# Patient Record
Sex: Female | Born: 1955 | Race: Black or African American | Hispanic: No | Smoking: Former smoker
Health system: Southern US, Community
[De-identification: ages and names within clinical notes are randomized; demographics above are authoritative.]

## PROBLEM LIST (undated history)

## (undated) DIAGNOSIS — E7800 Pure hypercholesterolemia, unspecified: Secondary | ICD-10-CM

## (undated) DIAGNOSIS — I10 Essential (primary) hypertension: Secondary | ICD-10-CM

## (undated) DIAGNOSIS — Z86010 Personal history of colonic polyps: Secondary | ICD-10-CM

## (undated) HISTORY — DX: Personal history of colonic polyps: Z86.010

## (undated) HISTORY — PX: INCISION AND DRAINAGE: SHX5863

---

## 1997-05-09 HISTORY — PX: ABDOMINAL HYSTERECTOMY: SHX81

## 2002-04-15 ENCOUNTER — Ambulatory Visit (HOSPITAL_COMMUNITY): Admission: RE | Admit: 2002-04-15 | Discharge: 2002-04-15 | Payer: Self-pay | Admitting: Family Medicine

## 2002-04-15 ENCOUNTER — Encounter: Payer: Self-pay | Admitting: Family Medicine

## 2002-05-09 HISTORY — PX: OTHER SURGICAL HISTORY: SHX169

## 2002-06-06 ENCOUNTER — Encounter: Admission: RE | Admit: 2002-06-06 | Discharge: 2002-06-06 | Payer: Self-pay | Admitting: Family Medicine

## 2002-06-06 ENCOUNTER — Encounter: Payer: Self-pay | Admitting: Family Medicine

## 2003-06-24 ENCOUNTER — Emergency Department (HOSPITAL_COMMUNITY): Admission: EM | Admit: 2003-06-24 | Discharge: 2003-06-25 | Payer: Self-pay | Admitting: Emergency Medicine

## 2003-11-15 ENCOUNTER — Emergency Department (HOSPITAL_COMMUNITY): Admission: EM | Admit: 2003-11-15 | Discharge: 2003-11-15 | Payer: Self-pay | Admitting: Emergency Medicine

## 2004-03-16 ENCOUNTER — Other Ambulatory Visit: Admission: RE | Admit: 2004-03-16 | Discharge: 2004-03-16 | Payer: Self-pay | Admitting: Family Medicine

## 2004-05-09 HISTORY — PX: COLONOSCOPY: SHX174

## 2004-10-29 ENCOUNTER — Ambulatory Visit (HOSPITAL_COMMUNITY): Admission: RE | Admit: 2004-10-29 | Discharge: 2004-10-29 | Payer: Self-pay | Admitting: Gastroenterology

## 2005-02-21 ENCOUNTER — Emergency Department (HOSPITAL_COMMUNITY): Admission: EM | Admit: 2005-02-21 | Discharge: 2005-02-21 | Payer: Self-pay | Admitting: Emergency Medicine

## 2005-04-26 ENCOUNTER — Emergency Department (HOSPITAL_COMMUNITY): Admission: EM | Admit: 2005-04-26 | Discharge: 2005-04-26 | Payer: Self-pay | Admitting: Emergency Medicine

## 2005-08-16 ENCOUNTER — Emergency Department (HOSPITAL_COMMUNITY): Admission: EM | Admit: 2005-08-16 | Discharge: 2005-08-16 | Payer: Self-pay | Admitting: Emergency Medicine

## 2005-08-30 ENCOUNTER — Encounter (INDEPENDENT_AMBULATORY_CARE_PROVIDER_SITE_OTHER): Payer: Self-pay | Admitting: *Deleted

## 2005-08-30 ENCOUNTER — Ambulatory Visit (HOSPITAL_BASED_OUTPATIENT_CLINIC_OR_DEPARTMENT_OTHER): Admission: RE | Admit: 2005-08-30 | Discharge: 2005-08-30 | Payer: Self-pay | Admitting: Surgery

## 2006-01-28 ENCOUNTER — Emergency Department: Payer: Self-pay | Admitting: Emergency Medicine

## 2006-12-26 ENCOUNTER — Emergency Department (HOSPITAL_COMMUNITY): Admission: EM | Admit: 2006-12-26 | Discharge: 2006-12-26 | Payer: Self-pay | Admitting: Emergency Medicine

## 2007-03-07 ENCOUNTER — Emergency Department (HOSPITAL_COMMUNITY): Admission: EM | Admit: 2007-03-07 | Discharge: 2007-03-07 | Payer: Self-pay | Admitting: Emergency Medicine

## 2008-02-12 ENCOUNTER — Emergency Department (HOSPITAL_COMMUNITY): Admission: EM | Admit: 2008-02-12 | Discharge: 2008-02-12 | Payer: Self-pay | Admitting: Emergency Medicine

## 2008-04-19 ENCOUNTER — Emergency Department (HOSPITAL_COMMUNITY): Admission: EM | Admit: 2008-04-19 | Discharge: 2008-04-19 | Payer: Self-pay | Admitting: Emergency Medicine

## 2008-11-11 ENCOUNTER — Emergency Department (HOSPITAL_COMMUNITY): Admission: EM | Admit: 2008-11-11 | Discharge: 2008-11-12 | Payer: Self-pay | Admitting: Emergency Medicine

## 2009-08-12 ENCOUNTER — Emergency Department (HOSPITAL_COMMUNITY): Admission: EM | Admit: 2009-08-12 | Discharge: 2009-08-12 | Payer: Self-pay | Admitting: Emergency Medicine

## 2009-10-10 ENCOUNTER — Emergency Department (HOSPITAL_COMMUNITY): Admission: EM | Admit: 2009-10-10 | Discharge: 2009-10-10 | Payer: Self-pay | Admitting: Emergency Medicine

## 2009-11-01 ENCOUNTER — Emergency Department (HOSPITAL_COMMUNITY): Admission: EM | Admit: 2009-11-01 | Discharge: 2009-11-01 | Payer: Self-pay | Admitting: Emergency Medicine

## 2009-12-20 ENCOUNTER — Emergency Department (HOSPITAL_COMMUNITY): Admission: EM | Admit: 2009-12-20 | Discharge: 2009-12-20 | Payer: Self-pay | Admitting: Emergency Medicine

## 2010-04-17 ENCOUNTER — Emergency Department (HOSPITAL_COMMUNITY)
Admission: EM | Admit: 2010-04-17 | Discharge: 2010-04-17 | Payer: Self-pay | Source: Home / Self Care | Admitting: Emergency Medicine

## 2010-07-19 LAB — URINALYSIS, ROUTINE W REFLEX MICROSCOPIC
Bilirubin Urine: NEGATIVE
Glucose, UA: NEGATIVE mg/dL
Hgb urine dipstick: NEGATIVE
Ketones, ur: NEGATIVE mg/dL
Specific Gravity, Urine: 1.02 (ref 1.005–1.030)
Urobilinogen, UA: 0.2 mg/dL (ref 0.0–1.0)
pH: 6 (ref 5.0–8.0)

## 2010-07-19 LAB — URINE MICROSCOPIC-ADD ON

## 2010-07-19 LAB — URINE CULTURE
Colony Count: >=100000
Culture  Setup Time: 201112101739

## 2010-07-25 LAB — CBC
HCT: 40.8 % (ref 36.0–46.0)
Hemoglobin: 14.3 g/dL (ref 12.0–15.0)
MCHC: 34.9 g/dL (ref 30.0–36.0)
RBC: 4.55 MIL/uL (ref 3.87–5.11)
RDW: 12.4 % (ref 11.5–15.5)

## 2010-07-25 LAB — DIFFERENTIAL
Lymphocytes Relative: 17 % (ref 12–46)
Lymphs Abs: 1.1 10*3/uL (ref 0.7–4.0)
Monocytes Absolute: 1.5 10*3/uL — ABNORMAL HIGH (ref 0.1–1.0)

## 2010-07-25 LAB — POCT I-STAT, CHEM 8
Chloride: 108 mEq/L (ref 96–112)
Glucose, Bld: 92 mg/dL (ref 70–99)
HCT: 44 % (ref 36.0–46.0)
Potassium: 4.1 mEq/L (ref 3.5–5.1)
Sodium: 140 mEq/L (ref 135–145)

## 2010-09-24 NOTE — Op Note (Signed)
Amy Vincent, Amy Vincent               ACCOUNT NO.:  [id number]   MEDICAL RECORD NO.:  [id number]          PATIENT TYPE:  AMB   LOCATION:  ENDO                         FACILITY:  [facility]   PHYSICIAN:  Anselmo Rod, M.D.  DATE OF BIRTH:  Nov 10, 1955   DATE OF PROCEDURE:  10/29/2004  DATE OF DISCHARGE:                                 OPERATIVE REPORT   PROCEDURE PERFORMED:  Screening colonoscopy.   ENDOSCOPIST:  Anselmo Rod, M.D.   INSTRUMENT USED:  Olympus video colonoscope.   INDICATIONS FOR PROCEDURE:  A 55 year old, African-American female with a  family history of colon cancer, undergoing a screening colonoscopy.  The  patient has a history of rectal bleeding on one occasion, with guaiac  positive stools found on a physical exam in the office.  Rule out colonic  polyps, masses, etc.   PRE-PROCEDURE PREPARATION:  Informed consent was procured from the patient.  The patient was fasted for eight hours prior to the procedure and prepped  with a bottle of magnesium citrate and a gallon of GoLYTELY the night prior  to the procedure.  The risks and benefits of the procedure including a 10%  miss rate of cancer and polyps were discussed with the patient as well.   PRE-PROCEDURE PHYSICAL:  VITAL SIGNS:  The patient had stable vital signs.  NECK:  Supple.  CHEST:  Clear to auscultation.  CARDIOVASCULAR:  S1, S2 regular.  ABDOMEN:  Soft, with normal bowel sounds.   DESCRIPTION OF THE PROCEDURE:  The patient was placed in the left lateral  decubitus position, sedated with 50 mg of Demerol and 5 mg of Versed in slow  incremental doses.  Once the patient was adequately sedated and maintained  on low-flow oxygen and continuous cardiac monitoring, the Olympus video  colonoscope was advanced from the rectum to the cecum.  There was some  residual stool in the colon.  Multiple washings were done.  No masses,  polyps, erosions, ulcerations, or diverticula were seen.  Retroflexion in  the  rectum revealed no abnormalities.   IMPRESSION:  Normal colonoscopy up to the cecum.  No masses, polyps, or  diverticula seen.  No source of bleeding identified.   RECOMMENDATIONS:  1.  Continue a high-fiber diet, with liberal fluid intake.  Repeat guaiac      testing will be done in the next three months, and further      recommendations made as needed.  2.  Repeat colonoscopy in the next five years or if need be.       JNM/MEDQ  D:  10/29/2004  T:  10/30/2004  Job:  [id number]   cc:   Candyce Churn. Allyne Gee, M.D.  [address]  [address]  [place]  [place] [address]  Fax: [phone]

## 2010-09-24 NOTE — Op Note (Signed)
Amy Vincent, Amy Vincent               ACCOUNT NO.:  [id number]   MEDICAL RECORD NO.:  [id number]          PATIENT TYPE:  AMB   LOCATION:  DSC                          FACILITY:  [facility]   PHYSICIAN:  Wilmon Arms. Corliss Skains, M.D. DATE OF BIRTH:  1956-04-22   DATE OF PROCEDURE:  08/30/2005  DATE OF DISCHARGE:                                 OPERATIVE REPORT   PREOPERATIVE DIAGNOSIS:  Lipoma of the back.   POSTOPERATIVE DIAGNOSIS:  Lipoma of the back.   PROCEDURE PERFORMED:  Excision of lipoma of the mid back.   SURGEON:  Wilmon Arms. Tsuei, M.D.   ANESTHESIA:  General endotracheal.   INDICATIONS:  The patient is a 55 year old female who presents with a four  year history of a slow growing mass in the back. This has become  uncomfortable. On examination, it was felt to be a lipoma. Due to its  location, the decision was made to proceed with excision in the operating  room.   DESCRIPTION OF PROCEDURE:  The patient was brought to the operating room and  placed in a supine position on her stretcher. After an adequate level of  general endotracheal anesthesia was obtained, she was flipped to a prone  position with appropriate padding. Once her airway was secure, her back was  prepped with Betadine and draped in sterile fashion. The area over the  lipoma was infiltrated with 10 mL of 0.25% Marcaine with epinephrine. A  transverse incision was made. Dissection was carried down to the lipoma. The  lipoma was densely adherent to the surrounding tissue. This was extended all  the way down to the fascia. The lipoma was excised in its entirety using  cautery. Once the lipoma was excised, it was sent for pathologic  examination. The wound was then irrigated. Due to the large cavity, a 7  Blake drain was inserted through a stab incision to prevent a seroma. The  wound was closed with a deep layer of 3-0 Vicryl and a subcuticular layer of  4-0 Monocryl. Steri-Strips and clean dressing was applied. The drain  was  placed to bulb suction. The patient was then explained and brought to  recovery in stable condition. All sponge, instrument, and needle counts were  correct.      Wilmon Arms. Tsuei, M.D.  Electronically Signed     MKT/MEDQ  D:  08/30/2005  T:  08/30/2005  Job:  [id number]

## 2011-02-16 LAB — CBC
HCT: 40.3
Hemoglobin: 14
MCHC: 34.7
MCV: 85.2
Platelets: 282
RBC: 4.73
RDW: 13.2
WBC: 7.4

## 2011-02-16 LAB — URINALYSIS, ROUTINE W REFLEX MICROSCOPIC
Bilirubin Urine: NEGATIVE
Glucose, UA: NEGATIVE
Hgb urine dipstick: NEGATIVE
Ketones, ur: NEGATIVE
Nitrite: NEGATIVE
Protein, ur: NEGATIVE
Specific Gravity, Urine: 1.03
Urobilinogen, UA: 1
pH: 6

## 2011-02-16 LAB — DIFFERENTIAL
Eosinophils Absolute: 0.3
Lymphocytes Relative: 37
Lymphs Abs: 2.7
Monocytes Absolute: 0.9 — ABNORMAL HIGH
Monocytes Relative: 13 — ABNORMAL HIGH
Neutro Abs: 3.3
Neutrophils Relative %: 45

## 2011-02-16 LAB — URINE MICROSCOPIC-ADD ON

## 2011-02-16 LAB — GC/CHLAMYDIA PROBE AMP, GENITAL
Chlamydia, DNA Probe: NEGATIVE
GC Probe Amp, Genital: NEGATIVE

## 2011-02-16 LAB — RPR: RPR Ser Ql: NONREACTIVE

## 2011-03-18 ENCOUNTER — Emergency Department (HOSPITAL_COMMUNITY)
Admission: EM | Admit: 2011-03-18 | Discharge: 2011-03-18 | Disposition: A | Discharge status: Home / Self Care | Payer: Self-pay | Attending: Emergency Medicine | Admitting: Emergency Medicine

## 2011-03-18 ENCOUNTER — Encounter: Payer: Self-pay | Admitting: *Deleted

## 2011-03-18 DIAGNOSIS — M62830 Muscle spasm of back: Secondary | ICD-10-CM

## 2011-03-18 DIAGNOSIS — M546 Pain in thoracic spine: Secondary | ICD-10-CM | POA: Insufficient documentation

## 2011-03-18 DIAGNOSIS — M5380 Other specified dorsopathies, site unspecified: Secondary | ICD-10-CM | POA: Insufficient documentation

## 2011-03-18 DIAGNOSIS — R32 Unspecified urinary incontinence: Secondary | ICD-10-CM | POA: Insufficient documentation

## 2011-03-18 DIAGNOSIS — R209 Unspecified disturbances of skin sensation: Secondary | ICD-10-CM | POA: Insufficient documentation

## 2011-03-18 DIAGNOSIS — I10 Essential (primary) hypertension: Secondary | ICD-10-CM | POA: Insufficient documentation

## 2011-03-18 HISTORY — DX: Essential (primary) hypertension: I10

## 2011-03-18 MED ORDER — IBUPROFEN 600 MG PO TABS: 600.0000 mg | ORAL_TABLET | Freq: Four times a day (QID) | ORAL | Status: AC | PRN

## 2011-03-18 MED ORDER — KETOROLAC TROMETHAMINE 60 MG/2ML IM SOLN
60.0000 mg | Freq: Once | INTRAMUSCULAR | Status: AC
  Administered 2011-03-18: 60 mg via INTRAMUSCULAR
  Filled 2011-03-18: qty 2

## 2011-03-18 MED ORDER — DIAZEPAM 5 MG PO TABS: 5.0000 mg | ORAL_TABLET | Freq: Two times a day (BID) | ORAL | Status: AC

## 2011-03-18 MED ORDER — DIAZEPAM 5 MG/ML IJ SOLN
5.0000 mg | Freq: Once | INTRAMUSCULAR | Status: AC
  Administered 2011-03-18: 10 mg via INTRAMUSCULAR
  Filled 2011-03-18: qty 2

## 2011-03-18 NOTE — ED Provider Notes (Signed)
Medical screening examination/treatment/procedure(s) were performed by non-physician practitioner and as supervising physician I was immediately available for consultation/collaboration.   [name], MD 03/18/11 1843 

## 2011-03-18 NOTE — ED Provider Notes (Signed)
History     CSN: [id number] Arrival date & time: 03/18/2011  8:27 AM   First MD Initiated Contact with Patient 03/18/11 [phone]      Chief Complaint  Patient presents with  . Back Pain    (Consider location/radiation/quality/duration/timing/severity/associated sxs/prior treatment) HPI Comments: Patient here with mid thoracic back pain without radiation/  Reports that the pain is episodic in nature depending on how she moves.  States that she was getting her clothes on today when this occurred.  Reports a history of same in the past and noted that it was muscle spasm.  Denies fever, chills, chest pain, shortness of breath, reports that when the pain came on today, it was so bad that she urinated on herself but denies any retention or further loss of control of bowels of bladder.  Patient is a 55 y.o. female presenting with back pain. The history is provided by the patient. No language interpreter was used.  Back Pain  This is a new problem. The current episode started 3 to 5 hours ago. The problem occurs constantly. The problem has been gradually worsening. The pain is associated with no known injury. The pain is present in the thoracic spine. The quality of the pain is described as stabbing. The pain does not radiate. The pain is at a severity of 8/10. The pain is moderate. The symptoms are aggravated by certain positions. The pain is worse during the day. Associated symptoms include bladder incontinence and paresthesias. Pertinent negatives include no chest pain, no numbness, no headaches, no paresis, no tingling and no weakness.    Past Medical History  Diagnosis Date  . Hypertension     History reviewed. No pertinent past surgical history.  History reviewed. No pertinent family history.  History  Substance Use Topics  . Smoking status: Never Smoker   . Smokeless tobacco: Never Used  . Alcohol Use: Yes    OB History    Grav Para Term Preterm Abortions TAB SAB Ect Mult Living                Review of Systems  Constitutional: Negative.   HENT: Negative.   Respiratory: Negative.   Cardiovascular: Negative for chest pain.  Genitourinary: Positive for bladder incontinence. Negative for flank pain and decreased urine volume.  Musculoskeletal: Positive for back pain.  Neurological: Positive for paresthesias. Negative for tingling, weakness, numbness and headaches.  Hematological: Negative.   Psychiatric/Behavioral: Negative.     Allergies  Review of patient's allergies indicates no known allergies.  Home Medications  No current outpatient prescriptions on file.  BP 150/101  Pulse 73  Temp(Src) 97.9 F (36.6 C) (Oral)  Resp 18  SpO2 98%  Physical Exam  Nursing note and vitals reviewed. Constitutional: She is oriented to person, place, and time. She appears well-developed and well-nourished.  HENT:  Head: Normocephalic and atraumatic.  Eyes: Conjunctivae are normal. Pupils are equal, round, and reactive to light.  Neck: Normal range of motion. Neck supple.  Cardiovascular: Normal rate, regular rhythm, normal heart sounds and intact distal pulses.   Pulmonary/Chest: Effort normal and breath sounds normal. No respiratory distress. She exhibits no tenderness.  Abdominal: Soft. Bowel sounds are normal.  Musculoskeletal: She exhibits tenderness. She exhibits no edema.       Thoracic back: She exhibits tenderness and spasm. She exhibits normal range of motion and no bony tenderness.  Neurological: She is alert and oriented to person, place, and time. No cranial nerve deficit.  Skin: Skin is  warm and dry.  Psychiatric: She has a normal mood and affect. Her behavior is normal. Judgment and thought content normal.    ED Course  Procedures (including critical care time)  Labs Reviewed - No data to display No results found.   Muscle Spasm   MDM  Patient with MSK mid back pain - no rash noted, worse with movement.  Plan to give toradol and valium here  in the ER and will re-evaluate        Scarlette Calico C. [place], [place] 03/18/11 1610  Patient reports marked improvement in pain after medication.  Will prescribe short course of same.  Izola Price [place], [place] 03/18/11 1028

## 2011-03-18 NOTE — ED Notes (Signed)
See triage note. Pt reports it feels like severe muscle spasms. No numbness or tingling associated with pain to lower extremities.

## 2011-03-18 NOTE — ED Notes (Signed)
Pt reports she was getting dressed them am and sudden onset of bilateral mid back pain, reports pain was so intense she had period of incontinence. Pt has been ambulatory. No fall associated.

## 2011-03-18 NOTE — ED Notes (Signed)
Patient laying on her stomach.   She reports onset of mid back pain when she was getting ready.  Patient states the pain caused her to "pee" on herself.  Patient denies hx of similar sx.  Patient states she has not had a recent cough nor has she traveled.

## 2011-04-25 ENCOUNTER — Emergency Department (HOSPITAL_COMMUNITY)
Admission: EM | Admit: 2011-04-25 | Discharge: 2011-04-25 | Disposition: A | Discharge status: Home / Self Care | Payer: Self-pay | Attending: Emergency Medicine | Admitting: Emergency Medicine

## 2011-04-25 DIAGNOSIS — R1031 Right lower quadrant pain: Secondary | ICD-10-CM | POA: Insufficient documentation

## 2011-04-25 DIAGNOSIS — K089 Disorder of teeth and supporting structures, unspecified: Secondary | ICD-10-CM | POA: Insufficient documentation

## 2011-04-25 DIAGNOSIS — R109 Unspecified abdominal pain: Secondary | ICD-10-CM | POA: Insufficient documentation

## 2011-04-25 DIAGNOSIS — K047 Periapical abscess without sinus: Secondary | ICD-10-CM | POA: Insufficient documentation

## 2011-04-25 DIAGNOSIS — I10 Essential (primary) hypertension: Secondary | ICD-10-CM | POA: Insufficient documentation

## 2011-04-25 LAB — URINE MICROSCOPIC-ADD ON

## 2011-04-25 LAB — URINALYSIS, ROUTINE W REFLEX MICROSCOPIC
Glucose, UA: NEGATIVE mg/dL
Hgb urine dipstick: NEGATIVE
Protein, ur: NEGATIVE mg/dL
Specific Gravity, Urine: 1.024 (ref 1.005–1.030)
pH: 6.5 (ref 5.0–8.0)

## 2011-04-25 LAB — COMPREHENSIVE METABOLIC PANEL
ALT: 38 U/L — ABNORMAL HIGH (ref 0–35)
AST: 30 U/L (ref 0–37)
CO2: 25 mEq/L (ref 19–32)
Calcium: 9.1 mg/dL (ref 8.4–10.5)
Creatinine, Ser: 0.67 mg/dL (ref 0.50–1.10)
GFR calc non Af Amer: >90 mL/min (ref >90)
Sodium: 140 mEq/L (ref 135–145)
Total Protein: 7.2 g/dL (ref 6.0–8.3)

## 2011-04-25 LAB — CBC
Hemoglobin: 14.3 g/dL (ref 12.0–15.0)
MCH: 30 pg (ref 26.0–34.0)
MCV: 84.7 fL (ref 78.0–100.0)
Platelets: 213 10*3/uL (ref 150–400)
RBC: 4.77 MIL/uL (ref 3.87–5.11)
WBC: 4.6 10*3/uL (ref 4.0–10.5)

## 2011-04-25 LAB — DIFFERENTIAL
Eosinophils Relative: 6 % — ABNORMAL HIGH (ref 0–5)
Lymphocytes Relative: 45 % (ref 12–46)
Lymphs Abs: 2.1 10*3/uL (ref 0.7–4.0)
Monocytes Relative: 13 % — ABNORMAL HIGH (ref 3–12)
Neutrophils Relative %: 34 % — ABNORMAL LOW (ref 43–77)

## 2011-04-25 MED ORDER — PENICILLIN V POTASSIUM 500 MG PO TABS: 500.0000 mg | ORAL_TABLET | Freq: Three times a day (TID) | ORAL | Status: AC

## 2011-04-25 MED ORDER — HYDROCHLOROTHIAZIDE 25 MG PO TABS: 25.0000 mg | ORAL_TABLET | Freq: Every day | ORAL | Status: DC

## 2011-04-25 NOTE — ED Provider Notes (Signed)
History     CSN: [id number] Arrival date & time: 04/25/2011  8:29 AM   First MD Initiated Contact with Patient 04/25/11 0930      10:25 AM HPI Patient reports she had a dental abscess low her left central incisor. States she called her dentist but could not afford to go to him. States he recommended that she reversed with abscess. States she did this and immediately began to have pain in her right lower abdomen. States pain is worse been persistent since event. Denies change in pain. Denies ability to reproduce pain. Denies associated nausea, vomiting, diarrhea, back pain, fever, urinary symptoms, vaginal symptoms describes pain as a nagging pain. Reports she took 100 mg of ibuprofen without improvement. Reports a history of a hysterectomy otherwise denies abdominal surgeries. Also reports a normal colonoscopy 5 years. Patient is a 55 y.o. female presenting with abdominal pain. The history is provided by the patient.  Abdominal Pain The primary symptoms of the illness include abdominal pain. The primary symptoms of the illness do not include fever, nausea, vomiting, dysuria, vaginal discharge or vaginal bleeding. The current episode started more than 2 days ago. The onset of the illness was sudden. The problem has not changed since onset. The patient states that she believes she is currently not pregnant. The patient has not had a change in bowel habit. Symptoms associated with the illness do not include chills, anorexia, heartburn, constipation, urgency, hematuria, frequency or back pain.    Past Medical History  Diagnosis Date  . Hypertension     No past surgical history on file.  No family history on file.  History  Substance Use Topics  . Smoking status: Never Smoker   . Smokeless tobacco: Never Used  . Alcohol Use: Yes    OB History    Grav Para Term Preterm Abortions TAB SAB Ect Mult Living                  Review of Systems  Constitutional: Negative for fever and  chills.  Gastrointestinal: Positive for abdominal pain. Negative for heartburn, nausea, vomiting, constipation and anorexia.  Genitourinary: Negative for dysuria, urgency, frequency, hematuria, flank pain, vaginal bleeding, vaginal discharge and vaginal pain.  Musculoskeletal: Negative for back pain.  All other systems reviewed and are negative.    Allergies  Review of patient's allergies indicates no known allergies.  Home Medications   Current Outpatient Rx  Name Route Sig Dispense Refill  . IBUPROFEN 800 MG PO TABS Oral Take 800 mg by mouth every 6 (six) hours as needed. For back pain       BP 150/96  Pulse 70  Temp(Src) 98 F (36.7 C) (Oral)  Resp 18  Ht 5' 3.5" (1.613 m)  Wt 160 lb (72.576 kg)  BMI 27.90 kg/m2  SpO2 99%  Physical Exam  Vitals reviewed. Constitutional: She is oriented to person, place, and time. Vital signs are normal. She appears well-developed and well-nourished.  HENT:  Head: Normocephalic and atraumatic.  Mouth/Throat: Dental abscesses (inferior to the left central incisor patient has a small area of dental abscess. No longer tender.) present.  Eyes: Conjunctivae are normal. Pupils are equal, round, and reactive to light.  Neck: Normal range of motion. Neck supple.  Cardiovascular: Normal rate, regular rhythm and normal heart sounds.  Exam reveals no friction rub.   No murmur heard. Pulmonary/Chest: Effort normal and breath sounds normal. She has no wheezes. She has no rhonchi. She has no rales. She exhibits no  tenderness.  Abdominal: Soft. Bowel sounds are normal. She exhibits no distension and no mass. There is no tenderness. There is no rebound and no guarding.  Musculoskeletal: Normal range of motion.  Neurological: She is alert and oriented to person, place, and time. Coordination normal.  Skin: Skin is warm and dry. No rash noted. No erythema. No pallor.    ED Course  Procedures    MDM     Discuss slight elevation in ALT and alkaline  phosphatase. Patient has absolutely no upper abdominal pain. No other significant findings active because of abdominal pain. Patient reports pain is not severe does not requesting medication. States she was just concerned pain was associated with popping dental abscess. States "I was scared it became an infection"     Thomasene Lot, [place] 04/25/11 1316  Discussed hypertension. Patient states she has not taken her blood pressure medication in one year. Blood pressure was 163/102 when I entered the room. States she does not have a primary care physician. Will refer patient to outpatient clinics in [facility] we'll write a prescription for HCTZ 25 mg.  Thomasene Lot, PA 04/25/11 1317

## 2011-04-25 NOTE — ED Notes (Signed)
Pt c/o abdominal pain and h/a-"popped" abcess in mouth-symptoms occurred after

## 2011-04-25 NOTE — ED Provider Notes (Signed)
Evaluation and management procedures were performed by the PA/NP under my supervision/collaboration.  Dione Booze, MD 04/25/11 [phone]

## 2012-01-30 ENCOUNTER — Emergency Department (HOSPITAL_COMMUNITY)
Admission: EM | Admit: 2012-01-30 | Discharge: 2012-01-30 | Disposition: A | Discharge status: Home / Self Care | Payer: Medicaid Other | Attending: Emergency Medicine | Admitting: Emergency Medicine

## 2012-01-30 ENCOUNTER — Encounter (HOSPITAL_COMMUNITY): Payer: Self-pay | Admitting: *Deleted

## 2012-01-30 ENCOUNTER — Emergency Department (HOSPITAL_COMMUNITY): Payer: Medicaid Other

## 2012-01-30 DIAGNOSIS — I10 Essential (primary) hypertension: Secondary | ICD-10-CM | POA: Insufficient documentation

## 2012-01-30 DIAGNOSIS — M25429 Effusion, unspecified elbow: Secondary | ICD-10-CM | POA: Insufficient documentation

## 2012-01-30 DIAGNOSIS — R109 Unspecified abdominal pain: Secondary | ICD-10-CM | POA: Insufficient documentation

## 2012-01-30 LAB — URINALYSIS, ROUTINE W REFLEX MICROSCOPIC
Glucose, UA: NEGATIVE mg/dL
Hgb urine dipstick: NEGATIVE
Nitrite: NEGATIVE
Urobilinogen, UA: 0.2 mg/dL (ref 0.0–1.0)
pH: 7 (ref 5.0–8.0)

## 2012-01-30 LAB — WET PREP, GENITAL
Clue Cells Wet Prep HPF POC: NONE SEEN
Trich, Wet Prep: NONE SEEN
Yeast Wet Prep HPF POC: NONE SEEN

## 2012-01-30 LAB — URINE MICROSCOPIC-ADD ON

## 2012-01-30 MED ORDER — TRAMADOL HCL 50 MG PO TABS
50.0000 mg | ORAL_TABLET | Freq: Once | ORAL | Status: AC
  Administered 2012-01-30: 50 mg via ORAL
  Filled 2012-01-30: qty 1

## 2012-01-30 MED ORDER — TRAMADOL HCL 50 MG PO TABS: 50.0000 mg | ORAL_TABLET | Freq: Four times a day (QID) | ORAL | Status: DC | PRN

## 2012-01-30 MED ORDER — ONDANSETRON 4 MG PO TBDP
ORAL_TABLET | ORAL | Status: AC
  Filled 2012-01-30: qty 2

## 2012-01-30 MED ORDER — ONDANSETRON 4 MG PO TBDP
8.0000 mg | ORAL_TABLET | Freq: Once | ORAL | Status: AC
  Administered 2012-01-30: 8 mg via ORAL

## 2012-01-30 MED ORDER — ONDANSETRON HCL 4 MG PO TABS: 4.0000 mg | ORAL_TABLET | Freq: Four times a day (QID) | ORAL | Status: DC

## 2012-01-30 NOTE — ED Notes (Signed)
To ED for eval of right lower quad pain for past cple days. Also c/o left arm pain at site of previous surgery. Pt states arm pain increases with movement. No urinary diff.

## 2012-01-30 NOTE — ED Notes (Signed)
Pt c/o left forearm pain, some swelling noted. Reporting vaginal discharge after taking antibiotics for dental infection. No vaginal pain or urinary s/s. Reporting RLQ pain, intermittent x 2 days. No n/v. A x 4

## 2012-01-30 NOTE — Progress Notes (Signed)
Orthopedic Tech Progress Note Patient Details:  Amy Vincent 02-Jul-1955 [id number] Arm sling applied to Left UE. Housekeeping staff also present in room. Patient stated it was ok to treat. Ortho Devices Type of Ortho Device: Arm foam sling Ortho Device/Splint Location: Applied to Left UE Ortho Device/Splint Interventions: Application   Asia R Thompson 01/30/2012, 11:26 AM

## 2012-01-30 NOTE — ED Notes (Signed)
Patient transported to X-ray 

## 2012-01-30 NOTE — ED Notes (Signed)
Called ortho to apply arm sling

## 2012-01-30 NOTE — ED Provider Notes (Signed)
History     CSN: [id number]  Arrival date & time 01/30/12  1308   First MD Initiated Contact with Patient 01/30/12 [phone]      Chief Complaint  Patient presents with  . Arm Pain  . Abdominal Pain    (Consider location/radiation/quality/duration/timing/severity/associated sxs/prior treatment) HPI Pt p/w 1 week of L elbow pain, worse with movement. No trauma. +previous surgery on elbow. No swelling, redness, or warmth.   Pt also c/o R pelvic pain and white vaginal d/c after finishing course of abx for dental abscess. No fever, chills, N/V/D or urinary symptoms.  Past Medical History  Diagnosis Date  . Hypertension     History reviewed. No pertinent past surgical history.  History reviewed. No pertinent family history.  History  Substance Use Topics  . Smoking status: Never Smoker   . Smokeless tobacco: Never Used  . Alcohol Use: Yes    OB History    Grav Para Term Preterm Abortions TAB SAB Ect Mult Living                  Review of Systems  Constitutional: Negative for fever, chills and fatigue.  Gastrointestinal: Positive for abdominal pain. Negative for nausea, vomiting and diarrhea.  Genitourinary: Positive for vaginal discharge. Negative for dysuria, hematuria, flank pain, vaginal bleeding and vaginal pain.  Musculoskeletal: Positive for arthralgias. Negative for back pain.  Skin: Negative for rash and wound.  Neurological: Negative for dizziness, weakness, numbness and headaches.    Allergies  Review of patient's allergies indicates no known allergies.  Home Medications   Current Outpatient Rx  Name Route Sig Dispense Refill  . IBUPROFEN 200 MG PO TABS Oral Take 200 mg by mouth every 6 (six) hours as needed. For pain    . ONDANSETRON HCL 4 MG PO TABS Oral Take 1 tablet (4 mg total) by mouth every 6 (six) hours. 12 tablet 0  . TRAMADOL HCL 50 MG PO TABS Oral Take 1 tablet (50 mg total) by mouth every 6 (six) hours as needed for pain. 15 tablet 0    BP  178/103  Pulse 75  Temp 98.2 F (36.8 C) (Oral)  Resp 16  SpO2 95%  Physical Exam  Nursing note and vitals reviewed. Constitutional: She is oriented to person, place, and time. She appears well-developed and well-nourished. No distress.  HENT:  Head: Normocephalic and atraumatic.  Mouth/Throat: Oropharynx is clear and moist.  Eyes: EOM are normal. Pupils are equal, round, and reactive to light.  Neck: Normal range of motion. Neck supple.  Cardiovascular: Normal rate and regular rhythm.   Pulmonary/Chest: Effort normal and breath sounds normal. No respiratory distress. She has no wheezes. She has no rales.  Abdominal: Soft. Bowel sounds are normal. She exhibits no distension and no mass. There is tenderness (very mild R pelvic, RLQ tenderness to palpation. No rebound or guarding). There is no rebound and no guarding.  Musculoskeletal: Normal range of motion. She exhibits tenderness (TTP over entire L elbow. Pain with ROM, esp extension ). She exhibits no edema.  Neurological: She is alert and oriented to person, place, and time.  Skin: Skin is warm and dry. No rash noted. No erythema.  Psychiatric: She has a normal mood and affect. Her behavior is normal.    ED Course  Procedures (including critical care time)  Labs Reviewed  URINALYSIS, ROUTINE W REFLEX MICROSCOPIC - Abnormal; Notable for the following:    Leukocytes, UA MODERATE (*)     All other  components within normal limits  URINE MICROSCOPIC-ADD ON - Abnormal; Notable for the following:    Squamous Epithelial / LPF FEW (*)     Bacteria, UA FEW (*)     All other components within normal limits  WET PREP, GENITAL - Abnormal; Notable for the following:    WBC, Wet Prep HPF POC MANY (*)     All other components within normal limits  GC/CHLAMYDIA PROBE AMP, GENITAL   Dg Elbow Complete Left  01/30/2012  *RADIOLOGY REPORT*  Clinical Data: Medial left elbow pain for 1 week, no recent injury  LEFT ELBOW - COMPLETE 3+ VIEW   Comparison: 11/12/1998  Findings: Surgical anchor at medial distal humerus. Scattered old calcified ossicles. Bones demineralized. Minimal degenerative changes of left elbow joint. Elbow joint effusion present. Question mild regional soft tissue swelling. No acute fracture, dislocation or bone destruction.  IMPRESSION: Mild degenerative changes of left elbow with presence of a small elbow joint effusion. Osseous demineralization. Old calcified bodies/ossicles.   Original Report Authenticated By: Lollie Marrow, M.D.      1. Elbow effusion   2. Abdominal pain of unknown etiology       MDM   Pt well appearing. Will d/c to f/u with orthopedist.   Do not suspect surgical cause for pt abd pain. Benign exam with normal labs. Return for worsening symptoms.        Loren Racer, MD 01/30/12 1243

## 2012-02-22 ENCOUNTER — Other Ambulatory Visit: Payer: Self-pay | Admitting: Family Medicine

## 2012-02-22 DIAGNOSIS — R109 Unspecified abdominal pain: Secondary | ICD-10-CM

## 2012-02-27 ENCOUNTER — Ambulatory Visit
Admission: RE | Admit: 2012-02-27 | Discharge: 2012-02-27 | Disposition: A | Discharge status: Home / Self Care | Payer: Medicaid Other | Source: Ambulatory Visit | Attending: Family Medicine | Admitting: Family Medicine

## 2012-02-27 DIAGNOSIS — R109 Unspecified abdominal pain: Secondary | ICD-10-CM

## 2012-05-24 ENCOUNTER — Encounter (HOSPITAL_COMMUNITY): Payer: Self-pay | Admitting: Emergency Medicine

## 2012-05-24 ENCOUNTER — Emergency Department (HOSPITAL_COMMUNITY)
Admission: EM | Admit: 2012-05-24 | Discharge: 2012-05-24 | Disposition: A | Discharge status: Home / Self Care | Payer: Self-pay | Attending: Emergency Medicine | Admitting: Emergency Medicine

## 2012-05-24 ENCOUNTER — Emergency Department (HOSPITAL_COMMUNITY): Payer: Self-pay

## 2012-05-24 DIAGNOSIS — Z79899 Other long term (current) drug therapy: Secondary | ICD-10-CM | POA: Insufficient documentation

## 2012-05-24 DIAGNOSIS — R0789 Other chest pain: Secondary | ICD-10-CM | POA: Insufficient documentation

## 2012-05-24 DIAGNOSIS — E7800 Pure hypercholesterolemia, unspecified: Secondary | ICD-10-CM | POA: Insufficient documentation

## 2012-05-24 DIAGNOSIS — I10 Essential (primary) hypertension: Secondary | ICD-10-CM | POA: Insufficient documentation

## 2012-05-24 HISTORY — DX: Pure hypercholesterolemia, unspecified: E78.00

## 2012-05-24 LAB — COMPREHENSIVE METABOLIC PANEL
ALT: 52 U/L — ABNORMAL HIGH (ref 0–35)
AST: 33 U/L (ref 0–37)
Albumin: 4.1 g/dL (ref 3.5–5.2)
CO2: 27 mEq/L (ref 19–32)
Calcium: 9.8 mg/dL (ref 8.4–10.5)
Chloride: 103 mEq/L (ref 96–112)
GFR calc non Af Amer: >90 mL/min (ref >90)
Sodium: 141 mEq/L (ref 135–145)
Total Bilirubin: 0.5 mg/dL (ref 0.3–1.2)

## 2012-05-24 LAB — CBC WITH DIFFERENTIAL/PLATELET
Basophils Absolute: 0.1 10*3/uL (ref 0.0–0.1)
Basophils Relative: 1 % (ref 0–1)
Lymphocytes Relative: 38 % (ref 12–46)
MCHC: 35.2 g/dL (ref 30.0–36.0)
Neutro Abs: 3.2 10*3/uL (ref 1.7–7.7)
Neutrophils Relative %: 42 % — ABNORMAL LOW (ref 43–77)
Platelets: 241 10*3/uL (ref 150–400)
RDW: 13.1 % (ref 11.5–15.5)
WBC: 7.8 10*3/uL (ref 4.0–10.5)

## 2012-05-24 LAB — POCT I-STAT TROPONIN I

## 2012-05-24 MED ORDER — HYDROCHLOROTHIAZIDE 25 MG PO TABS: 25.0000 mg | ORAL_TABLET | Freq: Every day | ORAL | Status: DC

## 2012-05-24 MED ORDER — IBUPROFEN 400 MG PO TABS
600.0000 mg | ORAL_TABLET | Freq: Once | ORAL | Status: AC
  Administered 2012-05-24: 600 mg via ORAL
  Filled 2012-05-24: qty 1

## 2012-05-24 MED ORDER — OXYCODONE-ACETAMINOPHEN 5-325 MG PO TABS: 1.0000 | ORAL_TABLET | ORAL | Status: DC | PRN

## 2012-05-24 NOTE — ED Provider Notes (Signed)
History     CSN: [id number]  Arrival date & time 05/24/12  1734   First MD Initiated Contact with Patient 05/24/12 1927      Chief Complaint  Patient presents with  . Chest Pain    (Consider location/radiation/quality/duration/timing/severity/associated sxs/prior treatment) Patient is a 57 y.o. female presenting with chest pain. The history is provided by the patient.  Chest Pain The chest pain began yesterday. Duration of episode(s) is 28 hours. Chest pain occurs constantly. The chest pain is unchanged. Associated with: spontaneous. The severity of the pain is moderate. The quality of the pain is described as aching and dull. The pain does not radiate. Chest pain is worsened by certain positions. Pertinent negatives for primary symptoms include no fever, no fatigue, no shortness of breath, no cough, no wheezing, no palpitations, no abdominal pain, no nausea and no vomiting.  Pertinent negatives for associated symptoms include no lower extremity edema, no near-syncope and no weakness. She tried nothing for the symptoms. Risk factors: hypertension.  Her past medical history is significant for hypertension.  Pertinent negatives for family medical history include: no heart disease in family, no hyperlipidemia in family and no hypertension in family.     Past Medical History  Diagnosis Date  . Hypertension   . High cholesterol     History reviewed. No pertinent past surgical history.  No family history on file.  History  Substance Use Topics  . Smoking status: Never Smoker   . Smokeless tobacco: Never Used  . Alcohol Use: Yes    OB History    Grav Para Term Preterm Abortions TAB SAB Ect Mult Living                  Review of Systems  Constitutional: Negative for fever and fatigue.  HENT: Negative for congestion, rhinorrhea and postnasal drip.   Eyes: Negative for photophobia and visual disturbance.  Respiratory: Negative for cough, chest tightness, shortness of breath  and wheezing.   Cardiovascular: Positive for chest pain. Negative for palpitations, leg swelling and near-syncope.  Gastrointestinal: Negative for nausea, vomiting, abdominal pain and diarrhea.  Genitourinary: Negative for urgency, frequency and difficulty urinating.  Musculoskeletal: Negative for back pain and arthralgias.  Skin: Negative for rash and wound.  Neurological: Negative for weakness and headaches.  Psychiatric/Behavioral: Negative for confusion and agitation.    Allergies  Review of patient's allergies indicates no known allergies.  Home Medications   Current Outpatient Rx  Name  Route  Sig  Dispense  Refill  . HYDROCHLOROTHIAZIDE 25 MG PO TABS   Oral   Take 1 tablet (25 mg total) by mouth daily.   14 tablet   0   . OXYCODONE-ACETAMINOPHEN 5-325 MG PO TABS   Oral   Take 1 tablet by mouth every 4 (four) hours as needed for pain.   6 tablet   0     BP 179/97  Pulse 87  Temp 97.9 F (36.6 C) (Oral)  Resp 20  SpO2 100%  Physical Exam  Nursing note and vitals reviewed. Constitutional: She is oriented to person, place, and time. She appears well-developed and well-nourished. No distress.  HENT:  Head: Normocephalic and atraumatic.  Mouth/Throat: Oropharynx is clear and moist.  Eyes: EOM are normal. Pupils are equal, round, and reactive to light.  Neck: Normal range of motion. Neck supple.  Cardiovascular: Normal rate, regular rhythm, normal heart sounds and intact distal pulses.   Pulmonary/Chest: Effort normal and breath sounds normal. She has no  wheezes. She has no rales. She exhibits tenderness.       significant anterior chest wall tenderness to sternum  Abdominal: Soft. Bowel sounds are normal. She exhibits no distension. There is no tenderness. There is no rebound and no guarding.       No abdominal tenderness.  Musculoskeletal: Normal range of motion. She exhibits no edema and no tenderness.  Lymphadenopathy:    She has no cervical adenopathy.    Neurological: She is alert and oriented to person, place, and time. She displays normal reflexes. No cranial nerve deficit. She exhibits normal muscle tone. Coordination normal.  Skin: Skin is warm and dry. No rash noted.  Psychiatric: She has a normal mood and affect. Her behavior is normal.    ED Course  Procedures (including critical care time)  Labs Reviewed  CBC WITH DIFFERENTIAL - Abnormal; Notable for the following:    RBC 5.24 (*)     Hemoglobin 15.8 (*)     Neutrophils Relative 42 (*)     Eosinophils Relative 8 (*)     All other components within normal limits  COMPREHENSIVE METABOLIC PANEL - Abnormal; Notable for the following:    ALT 52 (*)     Alkaline Phosphatase 178 (*)     All other components within normal limits  POCT I-STAT TROPONIN I   Dg Chest 2 View  05/24/2012  *RADIOLOGY REPORT*  Clinical Data: Chest pain.  Hypertension.  CHEST - 2 VIEW  Comparison: None.  Findings: Cardiac and mediastinal contours appear normal.  The lungs appear clear.  No pleural effusion is identified.  IMPRESSION:  No significant abnormality identified.   Original Report Authenticated By: Gaylyn Rong, M.D.     Date: 05/24/2012  Rate: 73  Rhythm: normal sinus rhythm  QRS Axis: normal  Intervals: QT prolonged  ST/T Wave abnormalities: nonspecific ST changes and nonspecific T wave changes  Conduction Disutrbances:none  Narrative Interpretation:   Old EKG Reviewed: none available    1. Musculoskeletal chest pain   2. HTN (hypertension)       MDM  47F with pmhx of HTN (no taking antihypertensives) here with chest pain since yesterday. Pain located anterior chest wall that is non-radiating and non-pleuritic. No shortness of breath, nausea, vomiting, lower extremity swelling, or diaphoresis. Pain has been constant for the last 28 hours. Does not take hormone replacement therapy, no recent surgeries, no unilateral leg swelling. Exam as noted above. Significant chest wall  tenderness. EKG without ischemic changes. Initial trop negative. Likely not ACS, PE, dissection, pneumonia, pericarditis. Likely musculoskeletal pain. Will obtain CXR and give Motrin.  CXR without acute pathology. Felt chest pain musculoskeletal. Needs pcp for BP. Will d/c with HCTZ. Stable for d/c home. Return precautions given.        Johnnette Gourd, MD 05/24/12 2317

## 2012-05-24 NOTE — ED Notes (Signed)
Pt c/o mid upper chest pain onset yesterday.  Pt denies radiating pain, shortness of breath or nausea.  Pt denies any cold symptoms

## 2012-05-24 NOTE — Discharge Instructions (Signed)
Chest Wall Pain  Chest wall pain is pain felt in or around the chest bones and muscles. It may take up to 6 weeks to get better. It may take longer if you are active. Chest wall pain can happen on its own. Other times, things like germs, injury, coughing, or exercise can cause the pain.  HOME CARE    Avoid activities that make you tired or cause pain. Try not to use your chest, belly (abdominal), or side muscles. Do not use heavy weights.   Put ice on the sore area.   Put ice in a plastic bag.   Place a towel between your skin and the bag.   Leave the ice on for 15 to 20 minutes for the first 2 days.   Only take medicine as told by your doctor.  GET HELP RIGHT AWAY IF:    You have more pain or are very uncomfortable.   You have a fever.   Your chest pain gets worse.   You have new problems.   You feel sick to your stomach (nauseous) or throw up (vomit).   You start to sweat or feel lightheaded.   You have a cough with mucus (phlegm).   You cough up blood.  MAKE SURE YOU:    Understand these instructions.   Will watch your condition.   Will get help right away if you are not doing well or get worse.  Document Released: 10/12/2007 Document Revised: 07/18/2011 Document Reviewed: 12/20/2010  [employer] Patient Information 2013 [employer].

## 2012-05-24 NOTE — ED Notes (Signed)
Pt placed on monitor, cont. Pulse ox and bp monitoring.

## 2012-05-26 NOTE — ED Provider Notes (Signed)
I have personally seen and examined the patient.  I have discussed the plan of care with the resident.  I have reviewed the documentation on PMH/FH/Soc. History.  I have reviewed the documentation of the resident and agree.  I have reviewed and agree with the ECG interpretation(s) documented by the resident except qt is not significantly prolonged.    Pt well appearing.  She tells me that her CP only started after a brisk coughing episode. She is well appearing.  Chest wall is clearly tender to palpation.  Stable for d/c   Joya Gaskins, MD 05/26/12 [phone]

## 2013-02-20 ENCOUNTER — Encounter (HOSPITAL_COMMUNITY): Payer: Self-pay | Admitting: Emergency Medicine

## 2013-02-20 ENCOUNTER — Emergency Department (HOSPITAL_COMMUNITY)
Admission: EM | Admit: 2013-02-20 | Discharge: 2013-02-20 | Disposition: A | Discharge status: Home / Self Care | Payer: Medicaid Other | Attending: Emergency Medicine | Admitting: Emergency Medicine

## 2013-02-20 ENCOUNTER — Emergency Department (HOSPITAL_COMMUNITY): Payer: Medicaid Other

## 2013-02-20 DIAGNOSIS — Z8639 Personal history of other endocrine, nutritional and metabolic disease: Secondary | ICD-10-CM | POA: Insufficient documentation

## 2013-02-20 DIAGNOSIS — R0789 Other chest pain: Secondary | ICD-10-CM

## 2013-02-20 DIAGNOSIS — Z862 Personal history of diseases of the blood and blood-forming organs and certain disorders involving the immune mechanism: Secondary | ICD-10-CM | POA: Insufficient documentation

## 2013-02-20 DIAGNOSIS — R071 Chest pain on breathing: Secondary | ICD-10-CM | POA: Insufficient documentation

## 2013-02-20 DIAGNOSIS — IMO0001 Reserved for inherently not codable concepts without codable children: Secondary | ICD-10-CM | POA: Insufficient documentation

## 2013-02-20 DIAGNOSIS — I10 Essential (primary) hypertension: Secondary | ICD-10-CM | POA: Insufficient documentation

## 2013-02-20 LAB — BASIC METABOLIC PANEL
CO2: 23 mEq/L (ref 19–32)
Glucose, Bld: 96 mg/dL (ref 70–99)
Potassium: 3.6 mEq/L (ref 3.5–5.1)
Sodium: 141 mEq/L (ref 135–145)

## 2013-02-20 LAB — CBC
Hemoglobin: 15.7 g/dL — ABNORMAL HIGH (ref 12.0–15.0)
RBC: 5.04 MIL/uL (ref 3.87–5.11)
WBC: 9 10*3/uL (ref 4.0–10.5)

## 2013-02-20 LAB — POCT I-STAT TROPONIN I: Troponin i, poc: 0 ng/mL (ref 0.00–0.08)

## 2013-02-20 MED ORDER — IBUPROFEN 400 MG PO TABS: 400.0000 mg | ORAL_TABLET | Freq: Four times a day (QID) | ORAL | Status: DC | PRN

## 2013-02-20 MED ORDER — METHOCARBAMOL 500 MG PO TABS: 500.0000 mg | ORAL_TABLET | Freq: Two times a day (BID) | ORAL | Status: DC

## 2013-02-20 NOTE — ED Notes (Signed)
Pt st's she started having pain in left chest this am while at rest.  St's pain comes and goes and describes as a sharp stabbing type pain with no radiation.  Pt st's she did how some nausea earlier but now has subsided.  Pt denies any other symptoms.  Pt alert and oriented x's 3.  Pt also c/o left flank pain onset several months ago denies urinary symptoms.

## 2013-02-20 NOTE — ED Notes (Addendum)
Pt alert, NAD, calm, interactive, skin W&D, resps e/u, speaking in clear complete sentences, "feels better". Family at [facility].

## 2013-02-20 NOTE — ED Notes (Signed)
Alert, NAD, calm, interactive, "feels better", (denies: pain ,sob, nausea or other sx).

## 2013-02-20 NOTE — ED Notes (Signed)
Pt. reports left chest pain onset yesterday morning with slight nausea , denies SOB or diaphoresis .

## 2013-02-20 NOTE — ED Provider Notes (Signed)
CSN: [id number]     Arrival date & time 02/20/13  0054 History   First MD Initiated Contact with Patient 02/20/13 0114     Chief Complaint  Patient presents with  . Chest Pain   (Consider location/radiation/quality/duration/timing/severity/associated sxs/prior Treatment) HPI Patient presents with left upper chest pain that that she noticed yesterday morning when she woke. Patient sleeps on her side. She states the pain is worse with palpation. Denies any shortness of breath or nausea. No known trauma. No known heavy lifting. She has no radiation of the pain down her arm or to her back. She's had no recent extended travel or surgeries. She has no lower extremity swelling or pain. Past Medical History  Diagnosis Date  . Hypertension   . High cholesterol    Past Surgical History  Procedure Laterality Date  . Abdominal hysterectomy    . Back surgery     No family history on file. History  Substance Use Topics  . Smoking status: Never Smoker   . Smokeless tobacco: Never Used  . Alcohol Use: Yes   OB History   Grav Para Term Preterm Abortions TAB SAB Ect Mult Living                 Review of Systems  Constitutional: Negative for fever and chills.  Respiratory: Negative for cough and shortness of breath.   Cardiovascular: Positive for chest pain. Negative for palpitations and leg swelling.  Gastrointestinal: Negative for nausea, vomiting and abdominal pain.  Musculoskeletal: Positive for myalgias. Negative for back pain.  Skin: Negative for rash and wound.  Neurological: Negative for dizziness, weakness, light-headedness, numbness and headaches.  All other systems reviewed and are negative.    Allergies  Review of patient's allergies indicates no known allergies.  Home Medications   Current Outpatient Rx  Name  Route  Sig  Dispense  Refill  . ibuprofen (ADVIL,MOTRIN) 200 MG tablet   Oral   Take 200 mg by mouth every 6 (six) hours as needed for pain.          BP  156/89  Pulse 66  Temp(Src) 97.9 F (36.6 C) (Oral)  Resp 14  SpO2 99% Physical Exam  Nursing note and vitals reviewed. Constitutional: She is oriented to person, place, and time. She appears well-developed and well-nourished. No distress.  HENT:  Head: Normocephalic and atraumatic.  Mouth/Throat: Oropharynx is clear and moist.  Eyes: EOM are normal. Pupils are equal, round, and reactive to light.  Neck: Normal range of motion. Neck supple.  Cardiovascular: Normal rate and regular rhythm.   Pulmonary/Chest: Effort normal and breath sounds normal. No respiratory distress. She has no wheezes. She has no rales. She exhibits tenderness (tenderness to palpation over the left upper lateral chest wall.pain is completely reproduced with palpation. Patient has no crepitance or deformity.).  Abdominal: Soft. Bowel sounds are normal. She exhibits no distension and no mass. There is no tenderness. There is no rebound and no guarding.  Musculoskeletal: Normal range of motion. She exhibits no edema and no tenderness.  No calf swelling or tenderness.  Neurological: She is alert and oriented to person, place, and time.  Moves all extremities without deficit. Sensation grossly intact.  Skin: Skin is warm and dry. No rash noted. No erythema.  Psychiatric: She has a normal mood and affect. Her behavior is normal.    ED Course  Procedures (including critical care time) Labs Review Labs Reviewed  CBC - Abnormal; Notable for the following:  Hemoglobin 15.7 (*)    MCHC 36.9 (*)    All other components within normal limits  BASIC METABOLIC PANEL  PRO B NATRIURETIC PEPTIDE  POCT I-STAT TROPONIN I   Imaging Review Dg Chest 2 View  02/20/2013   CLINICAL DATA:  Stabbing chest pain on the left side for 1 day.  EXAM: CHEST  2 VIEW  COMPARISON:  05/24/2012.  FINDINGS: Chronic interstitial prominence. No focal opacity or edema. No effusion or pneumothorax. Normal heart size and mediastinal contours.   IMPRESSION: Stable appearance of the chest. No evidence of acute cardiopulmonary disease.   Electronically Signed   By: Tiburcio Pea M.D.   On: 02/20/2013 01:36    EKG Interpretation   None       MDM  Patient's exam and history is consistent with chest wall pain. Initial troponin and EKG are normal. We'll repeat troponin and likely discharge home.  Second troponin is normal. We'll discharge home to follow up primary Dr. Return precautions given.  Loren Racer, MD 02/20/13 [phone]

## 2014-02-19 DIAGNOSIS — I10 Essential (primary) hypertension: Secondary | ICD-10-CM | POA: Diagnosis not present

## 2014-02-19 DIAGNOSIS — M545 Low back pain: Secondary | ICD-10-CM | POA: Diagnosis not present

## 2014-03-21 DIAGNOSIS — I10 Essential (primary) hypertension: Secondary | ICD-10-CM | POA: Diagnosis not present

## 2014-03-21 DIAGNOSIS — M545 Low back pain: Secondary | ICD-10-CM | POA: Diagnosis not present

## 2014-03-26 DIAGNOSIS — Z0000 Encounter for general adult medical examination without abnormal findings: Secondary | ICD-10-CM | POA: Diagnosis not present

## 2014-05-07 ENCOUNTER — Emergency Department (HOSPITAL_COMMUNITY)
Admission: EM | Admit: 2014-05-07 | Discharge: 2014-05-07 | Disposition: A | Discharge status: Home / Self Care | Payer: Medicare Other | Attending: Emergency Medicine | Admitting: Emergency Medicine

## 2014-05-07 ENCOUNTER — Emergency Department (HOSPITAL_COMMUNITY): Payer: Medicare Other

## 2014-05-07 ENCOUNTER — Encounter (HOSPITAL_COMMUNITY): Payer: Self-pay | Admitting: *Deleted

## 2014-05-07 DIAGNOSIS — N12 Tubulo-interstitial nephritis, not specified as acute or chronic: Secondary | ICD-10-CM

## 2014-05-07 DIAGNOSIS — I10 Essential (primary) hypertension: Secondary | ICD-10-CM | POA: Diagnosis not present

## 2014-05-07 DIAGNOSIS — R079 Chest pain, unspecified: Secondary | ICD-10-CM | POA: Diagnosis not present

## 2014-05-07 DIAGNOSIS — Z8639 Personal history of other endocrine, nutritional and metabolic disease: Secondary | ICD-10-CM | POA: Diagnosis not present

## 2014-05-07 LAB — CBC WITH DIFFERENTIAL/PLATELET
Basophils Absolute: 0 10*3/uL (ref 0.0–0.1)
Basophils Relative: 1 % (ref 0–1)
EOS ABS: 0.3 10*3/uL (ref 0.0–0.7)
Eosinophils Relative: 5 % (ref 0–5)
HEMATOCRIT: 43.6 % (ref 36.0–46.0)
HEMOGLOBIN: 15.3 g/dL — AB (ref 12.0–15.0)
LYMPHS ABS: 2.1 10*3/uL (ref 0.7–4.0)
Lymphocytes Relative: 32 % (ref 12–46)
MCH: 29.8 pg (ref 26.0–34.0)
MCHC: 35.1 g/dL (ref 30.0–36.0)
MCV: 85 fL (ref 78.0–100.0)
MONO ABS: 0.8 10*3/uL (ref 0.1–1.0)
MONOS PCT: 12 % (ref 3–12)
NEUTROS PCT: 50 % (ref 43–77)
Neutro Abs: 3.3 10*3/uL (ref 1.7–7.7)
Platelets: 257 10*3/uL (ref 150–400)
RBC: 5.13 MIL/uL — AB (ref 3.87–5.11)
RDW: 12.7 % (ref 11.5–15.5)
WBC: 6.6 10*3/uL (ref 4.0–10.5)

## 2014-05-07 LAB — I-STAT TROPONIN, ED: Troponin i, poc: 0 ng/mL (ref 0.00–0.08)

## 2014-05-07 LAB — URINALYSIS, ROUTINE W REFLEX MICROSCOPIC
GLUCOSE, UA: NEGATIVE mg/dL
HGB URINE DIPSTICK: NEGATIVE
KETONES UR: 15 mg/dL — AB
Nitrite: NEGATIVE
PROTEIN: NEGATIVE mg/dL
Specific Gravity, Urine: 1.022 (ref 1.005–1.030)
Urobilinogen, UA: 1 mg/dL (ref 0.0–1.0)
pH: 5.5 (ref 5.0–8.0)

## 2014-05-07 LAB — COMPREHENSIVE METABOLIC PANEL
ALK PHOS: 142 U/L — AB (ref 39–117)
ALT: 20 U/L (ref 0–35)
ANION GAP: 9 (ref 5–15)
AST: 21 U/L (ref 0–37)
Albumin: 4 g/dL (ref 3.5–5.2)
BILIRUBIN TOTAL: 0.9 mg/dL (ref 0.3–1.2)
BUN: 14 mg/dL (ref 6–23)
CHLORIDE: 105 meq/L (ref 96–112)
CO2: 24 mmol/L (ref 19–32)
CREATININE: 0.91 mg/dL (ref 0.50–1.10)
Calcium: 9.7 mg/dL (ref 8.4–10.5)
GFR calc non Af Amer: 68 mL/min — ABNORMAL LOW (ref >90)
GFR, EST AFRICAN AMERICAN: 79 mL/min — AB (ref >90)
GLUCOSE: 100 mg/dL — AB (ref 70–99)
POTASSIUM: 3.4 mmol/L — AB (ref 3.5–5.1)
Sodium: 138 mmol/L (ref 135–145)
TOTAL PROTEIN: 7.4 g/dL (ref 6.0–8.3)

## 2014-05-07 LAB — LIPASE, BLOOD: Lipase: 49 U/L (ref 11–59)

## 2014-05-07 LAB — URINE MICROSCOPIC-ADD ON

## 2014-05-07 MED ORDER — HYDROMORPHONE HCL 1 MG/ML IJ SOLN
1.0000 mg | Freq: Once | INTRAMUSCULAR | Status: AC
  Administered 2014-05-07: 1 mg via INTRAVENOUS
  Filled 2014-05-07: qty 1

## 2014-05-07 MED ORDER — DEXTROSE 5 % IV SOLN
1.0000 g | Freq: Once | INTRAVENOUS | Status: AC
  Administered 2014-05-07: 1 g via INTRAVENOUS
  Filled 2014-05-07: qty 10

## 2014-05-07 MED ORDER — CIPROFLOXACIN HCL 500 MG PO TABS: 500.0000 mg | ORAL_TABLET | Freq: Two times a day (BID) | ORAL | Status: DC

## 2014-05-07 MED ORDER — KETOROLAC TROMETHAMINE 30 MG/ML IJ SOLN
30.0000 mg | Freq: Once | INTRAMUSCULAR | Status: AC
  Administered 2014-05-07: 30 mg via INTRAVENOUS
  Filled 2014-05-07: qty 1

## 2014-05-07 MED ORDER — OXYCODONE-ACETAMINOPHEN 5-325 MG PO TABS: 1.0000 | ORAL_TABLET | Freq: Four times a day (QID) | ORAL | Status: DC | PRN

## 2014-05-07 MED ORDER — DIAZEPAM 5 MG/ML IJ SOLN
5.0000 mg | Freq: Once | INTRAMUSCULAR | Status: AC
  Administered 2014-05-07: 5 mg via INTRAVENOUS
  Filled 2014-05-07: qty 2

## 2014-05-07 MED ORDER — MORPHINE SULFATE 4 MG/ML IJ SOLN
4.0000 mg | Freq: Once | INTRAMUSCULAR | Status: AC
  Administered 2014-05-07: 4 mg via INTRAVENOUS
  Filled 2014-05-07: qty 1

## 2014-05-07 NOTE — ED Notes (Signed)
NAD at this time. Pt is leaving with friend.

## 2014-05-07 NOTE — ED Notes (Signed)
Pt reports chest pain x 2 days, started on right rib area and now is under both breasts. Denies sob or n/v. ekg done at triage. Airway intact.

## 2014-05-07 NOTE — ED Provider Notes (Signed)
CSN: [id number]     Arrival date & time 05/07/14  0263 History   First MD Initiated Contact with Patient 05/07/14 [phone]     Chief Complaint  Patient presents with  . Chest Pain     (Consider location/radiation/quality/duration/timing/severity/associated sxs/prior Treatment) The history is provided by the patient.  Amy Vincent is a 58 y.o. female hx of HL, HTN here with rib pain. Started with right rib and flank pain for the last 2 days. Pain is constant and worse with movement. Since yesterday moved to the left side of her ribs. Denies any strenuous activity or heavy lifting. Denies any shortness of breath. Currently pain is 8 out of 10. Denies any history of CAD and this is former smoker. Denies any family history of CAD.   Past Medical History  Diagnosis Date  . Hypertension   . High cholesterol    Past Surgical History  Procedure Laterality Date  . Abdominal hysterectomy    . Back surgery     History reviewed. No pertinent family history. History  Substance Use Topics  . Smoking status: Never Smoker   . Smokeless tobacco: Never Used  . Alcohol Use: Yes   OB History    No data available     Review of Systems  Cardiovascular: Positive for chest pain.  All other systems reviewed and are negative.     Allergies  Review of patient's allergies indicates no known allergies.  Home Medications   Prior to Admission medications   Medication Sig Start Date End Date Taking? Authorizing Provider  acetaminophen (TYLENOL) 500 MG tablet Take 500 mg by mouth every 6 (six) hours as needed for mild pain.   Yes Historical Provider, MD  losartan-hydrochlorothiazide (HYZAAR) 100-25 MG per tablet Take 1 tablet by mouth daily.   Yes Historical Provider, MD  simethicone (MYLICON) 850 MG chewable tablet Chew 125 mg by mouth every 6 (six) hours as needed for flatulence.   Yes Historical Provider, MD  ibuprofen (ADVIL,MOTRIN) 400 MG tablet Take 1 tablet (400 mg total) by mouth every 6  (six) hours as needed for pain. Patient not taking: Reported on 05/07/2014 02/20/13   Julianne Rice, MD  methocarbamol (ROBAXIN) 500 MG tablet Take 1 tablet (500 mg total) by mouth 2 (two) times daily. Patient not taking: Reported on 05/07/2014 02/20/13   Julianne Rice, MD   BP 132/74 mmHg  Pulse 58  Temp(Src) 98.5 F (36.9 C) (Oral)  Resp 15  SpO2 98% Physical Exam  Constitutional: She is oriented to person, place, and time. She appears well-developed.  Uncomfortable   HENT:  Head: Normocephalic.  Mouth/Throat: Oropharynx is clear and moist.  Eyes: Conjunctivae are normal. Pupils are equal, round, and reactive to light.  Neck: Normal range of motion. Neck supple.  Cardiovascular: Normal rate, regular rhythm and normal heart sounds.   Pulmonary/Chest: Effort normal and breath sounds normal. No respiratory distress. She has no wheezes. She has no rales.  Reproducible bilateral lower rib tenderness, no obvious deformity.   Abdominal: Soft. Bowel sounds are normal. She exhibits no distension.  Mild epigastric tenderness, no rebound. No RUQ tenderness. No CVAT   Musculoskeletal: Normal range of motion. She exhibits no edema.  Neurological: She is alert and oriented to person, place, and time. No cranial nerve deficit. Coordination normal.  Skin: Skin is warm and dry.  Psychiatric: She has a normal mood and affect. Her behavior is normal. Judgment and thought content normal.  Nursing note and vitals reviewed.  ED Course  Procedures (including critical care time) Labs Review Labs Reviewed  CBC WITH DIFFERENTIAL - Abnormal; Notable for the following:    RBC 5.13 (*)    Hemoglobin 15.3 (*)    All other components within normal limits  COMPREHENSIVE METABOLIC PANEL - Abnormal; Notable for the following:    Potassium 3.4 (*)    Glucose, Bld 100 (*)    Alkaline Phosphatase 142 (*)    GFR calc non Af Amer 68 (*)    GFR calc Af Amer 79 (*)    All other components within normal  limits  URINALYSIS, ROUTINE W REFLEX MICROSCOPIC - Abnormal; Notable for the following:    Color, Urine AMBER (*)    APPearance HAZY (*)    Bilirubin Urine SMALL (*)    Ketones, ur 15 (*)    Leukocytes, UA SMALL (*)    All other components within normal limits  URINE MICROSCOPIC-ADD ON - Abnormal; Notable for the following:    Squamous Epithelial / LPF MANY (*)    Bacteria, UA MANY (*)    Casts HYALINE CASTS (*)    All other components within normal limits  URINE CULTURE  LIPASE, BLOOD  I-STAT TROPOININ, ED    Imaging Review Dg Chest 2 View  05/07/2014   CLINICAL DATA:  One day history of chest pain  EXAM: CHEST  2 VIEW  COMPARISON:  February 20, 2013 and November 01, 2009  FINDINGS: There is no edema or consolidation. The interstitium remains slightly prominent, probably reflecting chronic inflammatory type change. Heart size and pulmonary vascularity are normal. No adenopathy. No bone lesions.  IMPRESSION: Mild interstitial prominence, stable. No frank edema or consolidation.   Electronically Signed   By: Lowella Grip M.D.   On: 05/07/2014 09:57     EKG Interpretation   Date/Time:  Wednesday May 07 2014 09:31:52 EST Ventricular Rate:  91 PR Interval:  120 QRS Duration: 70 QT Interval:  378 QTC Calculation: 464 R Axis:   -18 Text Interpretation:  Normal sinus rhythm with sinus arrhythmia Possible  Left atrial enlargement Nonspecific ST abnormality Abnormal ECG No  significant change since last tracing Confirmed by Londa Mackowski  MD, Waverley Krempasky ([id number])  on 05/07/2014 9:41:32 AM      MDM   Final diagnoses:  Chest pain    Amy Vincent is a 58 y.o. female here with rib pain. I think likely MSK in origin. I doubt PE or ACS. But consider pyelo vs stone as well. Will check basic labs and CXR and UA.  1:22 PM UA + UTI. WBC nl. No hemturia. Likely mild pyelo. Trop neg x 1 and symptoms for several days so unlikely ACS. Given ceftriaxone, urine culture sent, will d/c on cipro, prn  percocet     Wandra Arthurs, MD 05/07/14 1322

## 2014-05-07 NOTE — Discharge Instructions (Signed)
Take cipro twice a day for 10 days.   Stay hydrated.   Take motrin for pain,.   Take percocet for severe pain. Do NOT drive with it.   No heavy lifting.   Follow up with your doctor.   Return to ER if you have severe pain, vomiting, fevers.

## 2014-05-07 NOTE — ED Notes (Signed)
Dr. Yao at bedside. 

## 2014-05-11 LAB — URINE CULTURE: Colony Count: >=100000

## 2014-05-12 ENCOUNTER — Telehealth (HOSPITAL_BASED_OUTPATIENT_CLINIC_OR_DEPARTMENT_OTHER): Payer: Self-pay | Admitting: Emergency Medicine

## 2014-05-12 NOTE — Telephone Encounter (Signed)
Post ED Visit - Positive Culture Follow-up  Culture report reviewed by antimicrobial stewardship pharmacist: []  [place], Pharm.D., BCPS [x]  Heide Guile, Pharm.D., BCPS []  Alycia Rossetti, Pharm.D., BCPS []  [place], Pharm.D., BCPS, AAHIVP []  Legrand [place], Pharm.D., BCPS, AAHIVP []  Isac Sarna, Pharm.D., BCPS  Positive urine culture E. coli Treated with ciprofloxacin, organism sensitive to the same and no further patient follow-up is required at this time.  Hazle Nordmann 05/12/2014, 2:03 PM

## 2014-09-04 ENCOUNTER — Emergency Department (HOSPITAL_COMMUNITY): Payer: Medicare Other

## 2014-09-04 ENCOUNTER — Encounter (HOSPITAL_COMMUNITY): Payer: Self-pay | Admitting: Emergency Medicine

## 2014-09-04 ENCOUNTER — Emergency Department (HOSPITAL_COMMUNITY)
Admission: EM | Admit: 2014-09-04 | Discharge: 2014-09-04 | Disposition: A | Discharge status: Home / Self Care | Payer: Medicare Other | Attending: Emergency Medicine | Admitting: Emergency Medicine

## 2014-09-04 DIAGNOSIS — Z9889 Other specified postprocedural states: Secondary | ICD-10-CM | POA: Insufficient documentation

## 2014-09-04 DIAGNOSIS — A599 Trichomoniasis, unspecified: Secondary | ICD-10-CM | POA: Diagnosis not present

## 2014-09-04 DIAGNOSIS — I10 Essential (primary) hypertension: Secondary | ICD-10-CM | POA: Insufficient documentation

## 2014-09-04 DIAGNOSIS — Z792 Long term (current) use of antibiotics: Secondary | ICD-10-CM | POA: Diagnosis not present

## 2014-09-04 DIAGNOSIS — Z79899 Other long term (current) drug therapy: Secondary | ICD-10-CM | POA: Insufficient documentation

## 2014-09-04 DIAGNOSIS — A5901 Trichomonal vulvovaginitis: Secondary | ICD-10-CM | POA: Insufficient documentation

## 2014-09-04 DIAGNOSIS — Z8639 Personal history of other endocrine, nutritional and metabolic disease: Secondary | ICD-10-CM | POA: Diagnosis not present

## 2014-09-04 DIAGNOSIS — M546 Pain in thoracic spine: Secondary | ICD-10-CM

## 2014-09-04 DIAGNOSIS — K5730 Diverticulosis of large intestine without perforation or abscess without bleeding: Secondary | ICD-10-CM | POA: Diagnosis not present

## 2014-09-04 DIAGNOSIS — R109 Unspecified abdominal pain: Secondary | ICD-10-CM | POA: Diagnosis not present

## 2014-09-04 DIAGNOSIS — R110 Nausea: Secondary | ICD-10-CM | POA: Diagnosis not present

## 2014-09-04 DIAGNOSIS — N390 Urinary tract infection, site not specified: Secondary | ICD-10-CM

## 2014-09-04 LAB — COMPREHENSIVE METABOLIC PANEL
ALBUMIN: 4 g/dL (ref 3.5–5.2)
ALT: 37 U/L — AB (ref 0–35)
AST: 29 U/L (ref 0–37)
Alkaline Phosphatase: 153 U/L — ABNORMAL HIGH (ref 39–117)
Anion gap: 11 (ref 5–15)
BILIRUBIN TOTAL: 0.8 mg/dL (ref 0.3–1.2)
BUN: 12 mg/dL (ref 6–23)
CHLORIDE: 108 mmol/L (ref 96–112)
CO2: 22 mmol/L (ref 19–32)
Calcium: 9.6 mg/dL (ref 8.4–10.5)
Creatinine, Ser: 0.72 mg/dL (ref 0.50–1.10)
GFR calc Af Amer: >90 mL/min (ref >90)
GFR calc non Af Amer: >90 mL/min (ref >90)
Glucose, Bld: 96 mg/dL (ref 70–99)
Potassium: 3.6 mmol/L (ref 3.5–5.1)
SODIUM: 141 mmol/L (ref 135–145)
Total Protein: 7.5 g/dL (ref 6.0–8.3)

## 2014-09-04 LAB — CBC WITH DIFFERENTIAL/PLATELET
BASOS ABS: 0.1 10*3/uL (ref 0.0–0.1)
BASOS PCT: 1 % (ref 0–1)
EOS ABS: 0.5 10*3/uL (ref 0.0–0.7)
Eosinophils Relative: 9 % — ABNORMAL HIGH (ref 0–5)
HEMATOCRIT: 45.8 % (ref 36.0–46.0)
HEMOGLOBIN: 15.3 g/dL — AB (ref 12.0–15.0)
LYMPHS ABS: 2 10*3/uL (ref 0.7–4.0)
LYMPHS PCT: 32 % (ref 12–46)
MCH: 29 pg (ref 26.0–34.0)
MCHC: 33.4 g/dL (ref 30.0–36.0)
MCV: 86.7 fL (ref 78.0–100.0)
MONO ABS: 0.7 10*3/uL (ref 0.1–1.0)
MONOS PCT: 11 % (ref 3–12)
NEUTROS ABS: 2.9 10*3/uL (ref 1.7–7.7)
Neutrophils Relative %: 47 % (ref 43–77)
Platelets: 256 10*3/uL (ref 150–400)
RBC: 5.28 MIL/uL — AB (ref 3.87–5.11)
RDW: 12.9 % (ref 11.5–15.5)
WBC: 6.1 10*3/uL (ref 4.0–10.5)

## 2014-09-04 LAB — URINALYSIS, ROUTINE W REFLEX MICROSCOPIC
BILIRUBIN URINE: NEGATIVE
Glucose, UA: NEGATIVE mg/dL
Hgb urine dipstick: NEGATIVE
Ketones, ur: NEGATIVE mg/dL
Nitrite: NEGATIVE
Protein, ur: NEGATIVE mg/dL
Specific Gravity, Urine: 1.029 (ref 1.005–1.030)
Urobilinogen, UA: 0.2 mg/dL (ref 0.0–1.0)
pH: 5.5 (ref 5.0–8.0)

## 2014-09-04 LAB — URINE MICROSCOPIC-ADD ON

## 2014-09-04 LAB — TROPONIN I: Troponin I: <0.03 ng/mL (ref <0.031)

## 2014-09-04 LAB — LIPASE, BLOOD: Lipase: 30 U/L (ref 11–59)

## 2014-09-04 MED ORDER — CEPHALEXIN 500 MG PO CAPS: 500.0000 mg | ORAL_CAPSULE | Freq: Two times a day (BID) | ORAL | Status: DC

## 2014-09-04 MED ORDER — OXYCODONE-ACETAMINOPHEN 5-325 MG PO TABS: 1.0000 | ORAL_TABLET | Freq: Four times a day (QID) | ORAL | Status: DC | PRN

## 2014-09-04 MED ORDER — HYDROMORPHONE HCL 1 MG/ML IJ SOLN
1.0000 mg | Freq: Once | INTRAMUSCULAR | Status: AC
  Administered 2014-09-04: 1 mg via INTRAVENOUS
  Filled 2014-09-04: qty 1

## 2014-09-04 MED ORDER — CYCLOBENZAPRINE HCL 5 MG PO TABS: 5.0000 mg | ORAL_TABLET | Freq: Three times a day (TID) | ORAL | Status: DC | PRN

## 2014-09-04 MED ORDER — ONDANSETRON HCL 4 MG PO TABS: 4.0000 mg | ORAL_TABLET | Freq: Four times a day (QID) | ORAL | Status: DC

## 2014-09-04 MED ORDER — METRONIDAZOLE 500 MG PO TABS
2000.0000 mg | ORAL_TABLET | Freq: Once | ORAL | Status: AC
  Administered 2014-09-04: 2000 mg via ORAL
  Filled 2014-09-04: qty 4

## 2014-09-04 MED ORDER — ONDANSETRON HCL 4 MG/2ML IJ SOLN
4.0000 mg | Freq: Once | INTRAMUSCULAR | Status: AC
  Administered 2014-09-04: 4 mg via INTRAVENOUS
  Filled 2014-09-04: qty 2

## 2014-09-04 MED ORDER — KETOROLAC TROMETHAMINE 15 MG/ML IJ SOLN
15.0000 mg | Freq: Once | INTRAMUSCULAR | Status: AC
  Administered 2014-09-04: 15 mg via INTRAVENOUS
  Filled 2014-09-04: qty 1

## 2014-09-04 NOTE — ED Provider Notes (Signed)
CSN: [id number]     Arrival date & time 09/04/14  1028 History   First MD Initiated Contact with Patient 09/04/14 1049     Chief Complaint  Patient presents with  . Back Pain    Patient is a 59 y.o. female presenting with back pain. The history is provided by the patient. No language interpreter was used.  Back Pain  Amy Vincent presents for evaluation of back pain. She states that she's had about 2 days of severe mid back pain. The pain is bilateral but greatest on the left back. The pain is dominantly concentrated to ease off some last night. She denies any chest pain, abdominal pain, shortness of breath. The pain is worse with range of motion. She has nausea. She denies any fevers, vomiting, dysuria, diarrhea. She had similar symptoms on New Year's and was diagnosed with a kidney infection.  Past Medical History  Diagnosis Date  . Hypertension   . High cholesterol    Past Surgical History  Procedure Laterality Date  . Abdominal hysterectomy    . Back surgery     History reviewed. No pertinent family history. History  Substance Use Topics  . Smoking status: Never Smoker   . Smokeless tobacco: Never Used  . Alcohol Use: Yes   OB History    No data available     Review of Systems  Musculoskeletal: Positive for back pain.  All other systems reviewed and are negative.     Allergies  Review of patient's allergies indicates no known allergies.  Home Medications   Prior to Admission medications   Medication Sig Start Date End Date Taking? Authorizing Provider  acetaminophen (TYLENOL) 500 MG tablet Take 500 mg by mouth every 6 (six) hours as needed for mild pain.    Historical Provider, MD  ciprofloxacin (CIPRO) 500 MG tablet Take 1 tablet (500 mg total) by mouth 2 (two) times daily. 05/07/14   Wandra Arthurs, MD  ibuprofen (ADVIL,MOTRIN) 400 MG tablet Take 1 tablet (400 mg total) by mouth every 6 (six) hours as needed for pain. Patient not taking: Reported on 05/07/2014  02/20/13   Julianne Rice, MD  losartan-hydrochlorothiazide (HYZAAR) 100-25 MG per tablet Take 1 tablet by mouth daily.    Historical Provider, MD  methocarbamol (ROBAXIN) 500 MG tablet Take 1 tablet (500 mg total) by mouth 2 (two) times daily. Patient not taking: Reported on 05/07/2014 02/20/13   Julianne Rice, MD  oxyCODONE-acetaminophen (PERCOCET) 5-325 MG per tablet Take 1-2 tablets by mouth every 6 (six) hours as needed. 05/07/14   Wandra Arthurs, MD  simethicone (MYLICON) 756 MG chewable tablet Chew 125 mg by mouth every 6 (six) hours as needed for flatulence.    Historical Provider, MD   BP 155/124 mmHg  Pulse 79  Temp(Src) 97.9 F (36.6 C) (Oral)  Resp 18  SpO2 97% Physical Exam  Constitutional: She is oriented to person, place, and time. She appears well-developed and well-nourished. She appears distressed.  Uncomfortable appearing  HENT:  Head: Normocephalic and atraumatic.  Cardiovascular: Normal rate and regular rhythm.   No murmur heard. Pulmonary/Chest: Effort normal and breath sounds normal. No respiratory distress.  Abdominal: Soft. There is no tenderness. There is no rebound and no guarding.  Musculoskeletal: She exhibits no edema.  Diffuse lower thoracic upper lumbar paraspinous tenderness.  Neurological: She is alert and oriented to person, place, and time.  5/5 strength in all 4 extremities  Skin: Skin is warm and dry.  Psychiatric: She  has a normal mood and affect. Her behavior is normal.  Nursing note and vitals reviewed.   ED Course  Procedures (including critical care time) Labs Review Labs Reviewed  COMPREHENSIVE METABOLIC PANEL - Abnormal; Notable for the following:    ALT 37 (*)    Alkaline Phosphatase 153 (*)    All other components within normal limits  CBC WITH DIFFERENTIAL/PLATELET - Abnormal; Notable for the following:    RBC 5.28 (*)    Hemoglobin 15.3 (*)    Eosinophils Relative 9 (*)    All other components within normal limits   URINALYSIS, ROUTINE W REFLEX MICROSCOPIC - Abnormal; Notable for the following:    APPearance CLOUDY (*)    Leukocytes, UA MODERATE (*)    All other components within normal limits  URINE MICROSCOPIC-ADD ON - Abnormal; Notable for the following:    Squamous Epithelial / LPF MANY (*)    Bacteria, UA FEW (*)    Crystals CA OXALATE CRYSTALS (*)    All other components within normal limits  URINE CULTURE  TROPONIN I  LIPASE, BLOOD    Imaging Review Ct Renal Stone Study  09/04/2014   CLINICAL DATA:  Nausea with flank/mid back pain for 2 days  EXAM: CT ABDOMEN AND PELVIS WITHOUT CONTRAST  TECHNIQUE: Multidetector CT imaging of the abdomen and pelvis was performed following the standard protocol without oral or intravenous contrast material administration.  COMPARISON:  None.  FINDINGS: Lung bases are clear. There are foci of coronary artery calcification.  No focal liver lesions are identified on this noncontrast enhanced study. Gallbladder wall is not appreciably thickened. There is no biliary duct dilatation.  Spleen, pancreas, and adrenals appear normal.  There is a 1.5 x 1.1 cm cyst in the lateral aspect of the mid left kidney. There is no demonstrable hydronephrosis on either side. There is no intrarenal calculus on either side. There is no ureteral calculus on either side.  In the pelvis, the urinary bladder is decompressed. There may be mild urinary bladder wall thickening. There is no pelvic mass or pelvic fluid collection. Uterus is absent. There are scattered sigmoid diverticula without diverticulitis. The appendix appears normal.  There is no bowel obstruction. No free air or portal venous air. There are diverticula throughout the transverse and descending colon without diverticulitis.  There is no ascites, adenopathy, or abscess in the abdomen or pelvis. There is no abdominal aortic aneurysm. There are no blastic or lytic bone lesions. A benign hemangioma in L3 vertebral body is noted.   IMPRESSION: No renal or ureteral calculus.  No hydronephrosis.  Urinary bladder is largely decompressed. There may be mild wall thickening in the urinary bladder. This finding may warrant urinalysis to further assess given the possibility of a degree of underlying cystitis.  There are multiple colonic diverticula without diverticulitis. No bowel obstruction. No abscess. No mesenteric inflammation.  There are foci of coronary artery calcification apparent.   Electronically Signed   By: Lowella Grip III M.D.   On: 09/04/2014 12:26     EKG Interpretation   Date/Time:  Thursday September 04 2014 10:36:08 EDT Ventricular Rate:  76 PR Interval:  128 QRS Duration: 72 QT Interval:  400 QTC Calculation: 450 R Axis:   22 Text Interpretation:  Normal sinus rhythm Possible Left atrial enlargement  Nonspecific T wave abnormality Abnormal ECG Confirmed by Hazle Coca [phone])  on 09/04/2014 10:50:13 AM      MDM   Final diagnoses:  Left-sided thoracic back pain  Acute UTI  Trichomonas infection    Patient here for evaluation of back pain. Urinalysis is concerning for urinary tract infection. History of presentation is not consistent with ACS, biliary colic, dissection. Patient's pain is much improved on repeat evaluation in the emergency department. Discussed with patient homecare for musculoskeletal back pain as well as urinary tract infection. Urinalysis demonstrates Trichomonas was present on prior urinalysis. Discussed with patient findings of Trichomonas and discussed risks and benefits of treatment the patient would like treated. Return precautions discussed.    Amy Reichert, MD 09/04/14 [phone]

## 2014-09-04 NOTE — Discharge Instructions (Signed)
Back Pain, Adult Low back pain is very common. About 1 in 5 people have back pain.The cause of low back pain is rarely dangerous. The pain often gets better over time.About half of people with a sudden onset of back pain feel better in just 2 weeks. About 8 in 10 people feel better by 6 weeks.  CAUSES Some common causes of back pain include:  Strain of the muscles or ligaments supporting the spine.  Wear and tear (degeneration) of the spinal discs.  Arthritis.  Direct injury to the back. DIAGNOSIS Most of the time, the direct cause of low back pain is not known.However, back pain can be treated effectively even when the exact cause of the pain is unknown.Answering your caregiver's questions about your overall health and symptoms is one of the most accurate ways to make sure the cause of your pain is not dangerous. If your caregiver needs more information, he or she may order lab work or imaging tests (X-rays or MRIs).However, even if imaging tests show changes in your back, this usually does not require surgery. HOME CARE INSTRUCTIONS For many people, back pain returns.Since low back pain is rarely dangerous, it is often a condition that people can learn to [facility] their own.   Remain active. It is stressful on the back to sit or stand in one place. Do not sit, drive, or stand in one place for more than 30 minutes at a time. Take short walks on level surfaces as soon as pain allows.Try to increase the length of time you walk each day.  Do not stay in bed.Resting more than 1 or 2 days can delay your recovery.  Do not avoid exercise or work.Your body is made to move.It is not dangerous to be active, even though your back may hurt.Your back will likely heal faster if you return to being active before your pain is gone.  Pay attention to your body when you bend and lift. Many people have less discomfortwhen lifting if they bend their knees, keep the load close to their bodies,and  avoid twisting. Often, the most comfortable positions are those that put less stress on your recovering back.  Find a comfortable position to sleep. Use a firm mattress and lie on your side with your knees slightly bent. If you lie on your back, put a pillow under your knees.  Only take over-the-counter or prescription medicines as directed by your caregiver. Over-the-counter medicines to reduce pain and inflammation are often the most helpful.Your caregiver may prescribe muscle relaxant drugs.These medicines help dull your pain so you can more quickly return to your normal activities and healthy exercise.  Put ice on the injured area.  Put ice in a plastic bag.  Place a towel between your skin and the bag.  Leave the ice on for 15-20 minutes, 03-04 times a day for the first 2 to 3 days. After that, ice and heat may be alternated to reduce pain and spasms.  Ask your caregiver about trying back exercises and gentle massage. This may be of some benefit.  Avoid feeling anxious or stressed.Stress increases muscle tension and can worsen back pain.It is important to recognize when you are anxious or stressed and learn ways to manage it.Exercise is a great option. SEEK MEDICAL CARE IF:  You have pain that is not relieved with rest or medicine.  You have pain that does not improve in 1 week.  You have new symptoms.  You are generally not feeling well. SEEK  IMMEDIATE MEDICAL CARE IF:   You have pain that radiates from your back into your legs.  You develop new bowel or bladder control problems.  You have unusual weakness or numbness in your arms or legs.  You develop nausea or vomiting.  You develop abdominal pain.  You feel faint. Document Released: 04/25/2005 Document Revised: 10/25/2011 Document Reviewed: 08/27/2013 [facility] Patient Information 2015 [place], [place]. This information is not intended to replace advice given to you by your health care provider. Make sure you  discuss any questions you have with your health care provider.  Trichomoniasis Trichomoniasis is an infection caused by an organism called Trichomonas. The infection can affect both women and men. In women, the outer female genitalia and the vagina are affected. In men, the penis is mainly affected, but the prostate and other reproductive organs can also be involved. Trichomoniasis is a sexually transmitted infection (STI) and is most often passed to another person through sexual contact.  RISK FACTORS  Having unprotected sexual intercourse.  Having sexual intercourse with an infected partner. SIGNS AND SYMPTOMS  Symptoms of trichomoniasis in women include:  Abnormal gray-green frothy vaginal discharge.  Itching and irritation of the vagina.  Itching and irritation of the area outside the vagina. Symptoms of trichomoniasis in men include:   Penile discharge with or without pain.  Pain during urination. This results from inflammation of the urethra. DIAGNOSIS  Trichomoniasis may be found during a Pap test or physical exam. Your health care provider may use one of the following methods to help diagnose this infection:  Examining vaginal discharge under a microscope. For men, urethral discharge would be examined.  Testing the pH of the vagina with a test tape.  Using a vaginal swab test that checks for the Trichomonas organism. A test is available that provides results within a few minutes.  Doing a culture test for the organism. This is not usually needed. TREATMENT   You may be given medicine to fight the infection. Women should inform their health care provider if they could be or are pregnant. Some medicines used to treat the infection should not be taken during pregnancy.  Your health care provider may recommend over-the-counter medicines or creams to decrease itching or irritation.  Your sexual partner will need to be treated if infected. HOME CARE INSTRUCTIONS   Take  medicines only as directed by your health care provider.  Take over-the-counter medicine for itching or irritation as directed by your health care provider.  Do not have sexual intercourse while you have the infection.  Women should not douche or wear tampons while they have the infection.  Discuss your infection with your partner. Your partner may have gotten the infection from you, or you may have gotten it from your partner.  Have your sex partner get examined and treated if necessary.  Practice safe, informed, and protected sex.  See your health care provider for other STI testing. SEEK MEDICAL CARE IF:   You still have symptoms after you finish your medicine.  You develop abdominal pain.  You have pain when you urinate.  You have bleeding after sexual intercourse.  You develop a rash.  Your medicine makes you sick or makes you throw up (vomit). MAKE SURE YOU:  Understand these instructions.  Will watch your condition.  Will get help right away if you are not doing well or get worse. Document Released: 10/19/2000 Document Revised: 09/09/2013 Document Reviewed: 02/04/2013 [facility] Patient Information 2015 [place], [place]. This information is not  intended to replace advice given to you by your health care provider. Make sure you discuss any questions you have with your health care provider.  Urinary Tract Infection Urinary tract infections (UTIs) can develop anywhere along your urinary tract. Your urinary tract is your body's drainage system for removing wastes and extra water. Your urinary tract includes two kidneys, two ureters, a bladder, and a urethra. Your kidneys are a pair of bean-shaped organs. Each kidney is about the size of your fist. They are located below your ribs, one on each side of your spine. CAUSES Infections are caused by microbes, which are microscopic organisms, including fungi, viruses, and bacteria. These organisms are so small that they can only be  seen through a microscope. Bacteria are the microbes that most commonly cause UTIs. SYMPTOMS  Symptoms of UTIs may vary by age and gender of the patient and by the location of the infection. Symptoms in young women typically include a frequent and intense urge to urinate and a painful, burning feeling in the bladder or urethra during urination. Older women and men are more likely to be tired, shaky, and weak and have muscle aches and abdominal pain. A fever may mean the infection is in your kidneys. Other symptoms of a kidney infection include pain in your back or sides below the ribs, nausea, and vomiting. DIAGNOSIS To diagnose a UTI, your caregiver will ask you about your symptoms. Your caregiver also will ask to provide a urine sample. The urine sample will be tested for bacteria and white blood cells. White blood cells are made by your body to help fight infection. TREATMENT  Typically, UTIs can be treated with medication. Because most UTIs are caused by a bacterial infection, they usually can be treated with the use of antibiotics. The choice of antibiotic and length of treatment depend on your symptoms and the type of bacteria causing your infection. HOME CARE INSTRUCTIONS  If you were prescribed antibiotics, take them exactly as your caregiver instructs you. Finish the medication even if you feel better after you have only taken some of the medication.  Drink enough water and fluids to keep your urine clear or pale yellow.  Avoid caffeine, tea, and carbonated beverages. They tend to irritate your bladder.  Empty your bladder often. Avoid holding urine for long periods of time.  Empty your bladder before and after sexual intercourse.  After a bowel movement, women should cleanse from front to back. Use each tissue only once. SEEK MEDICAL CARE IF:   You have back pain.  You develop a fever.  Your symptoms do not begin to resolve within 3 days. SEEK IMMEDIATE MEDICAL CARE IF:   You  have severe back pain or lower abdominal pain.  You develop chills.  You have nausea or vomiting.  You have continued burning or discomfort with urination. MAKE SURE YOU:   Understand these instructions.  Will watch your condition.  Will get help right away if you are not doing well or get worse. Document Released: 02/02/2005 Document Revised: 10/25/2011 Document Reviewed: 06/03/2011 [facility] Patient Information 2015 [place], [place]. This information is not intended to replace advice given to you by your health care provider. Make sure you discuss any questions you have with your health care provider.

## 2014-09-04 NOTE — ED Notes (Signed)
Pt c/o mid back pain around to chest x 2 days with nausea and not able to eat

## 2014-09-06 LAB — URINE CULTURE: Colony Count: 6000

## 2014-12-22 DIAGNOSIS — M545 Low back pain: Secondary | ICD-10-CM | POA: Diagnosis not present

## 2014-12-22 DIAGNOSIS — I10 Essential (primary) hypertension: Secondary | ICD-10-CM | POA: Diagnosis not present

## 2015-03-20 ENCOUNTER — Ambulatory Visit: Payer: Medicare Other | Admitting: Family

## 2015-03-24 ENCOUNTER — Telehealth: Payer: Self-pay

## 2015-03-24 NOTE — Telephone Encounter (Signed)
Pt called with med name she couldn't remember. Losartan HCTZ 25mg 

## 2015-03-24 NOTE — Telephone Encounter (Signed)
Noted  

## 2015-03-25 ENCOUNTER — Encounter: Payer: Self-pay | Admitting: Family

## 2015-03-25 ENCOUNTER — Ambulatory Visit (INDEPENDENT_AMBULATORY_CARE_PROVIDER_SITE_OTHER): Payer: Medicare Other | Admitting: Family

## 2015-03-25 VITALS — BP 155/82 | HR 70 | Temp 98.4°F | Resp 18 | Ht 64.0 in | Wt 183.2 lb

## 2015-03-25 DIAGNOSIS — I10 Essential (primary) hypertension: Secondary | ICD-10-CM | POA: Diagnosis not present

## 2015-03-25 DIAGNOSIS — Z1239 Encounter for other screening for malignant neoplasm of breast: Secondary | ICD-10-CM

## 2015-03-25 DIAGNOSIS — E785 Hyperlipidemia, unspecified: Secondary | ICD-10-CM | POA: Diagnosis not present

## 2015-03-25 DIAGNOSIS — Z1231 Encounter for screening mammogram for malignant neoplasm of breast: Secondary | ICD-10-CM | POA: Diagnosis not present

## 2015-03-25 DIAGNOSIS — M545 Low back pain, unspecified: Secondary | ICD-10-CM

## 2015-03-25 DIAGNOSIS — Z0000 Encounter for general adult medical examination without abnormal findings: Secondary | ICD-10-CM

## 2015-03-25 DIAGNOSIS — G8929 Other chronic pain: Secondary | ICD-10-CM | POA: Insufficient documentation

## 2015-03-25 LAB — LIPID PANEL
Cholesterol: 200 mg/dL (ref 0–200)
HDL: 47.8 mg/dL (ref >39.00)
LDL CALC: 132 mg/dL — AB (ref 0–99)
NONHDL: 152.39
TRIGLYCERIDES: 103 mg/dL (ref 0.0–149.0)
Total CHOL/HDL Ratio: 4
VLDL: 20.6 mg/dL (ref 0.0–40.0)

## 2015-03-25 LAB — BASIC METABOLIC PANEL
BUN: 13 mg/dL (ref 6–23)
CALCIUM: 9.5 mg/dL (ref 8.4–10.5)
CHLORIDE: 108 meq/L (ref 96–112)
CO2: 26 meq/L (ref 19–32)
Creatinine, Ser: 0.63 mg/dL (ref 0.40–1.20)
GFR: 124.02 mL/min (ref >60.00)
Glucose, Bld: 78 mg/dL (ref 70–99)
POTASSIUM: 3.6 meq/L (ref 3.5–5.1)
Sodium: 141 mEq/L (ref 135–145)

## 2015-03-25 MED ORDER — MELOXICAM 7.5 MG PO TABS: 7.5000 mg | ORAL_TABLET | Freq: Every day | ORAL | Status: DC

## 2015-03-25 MED ORDER — METOPROLOL SUCCINATE ER 25 MG PO TB24
25.0000 mg | ORAL_TABLET | Freq: Every day | ORAL | Status: DC
Start: 2015-03-25 — End: 2015-09-03

## 2015-03-25 NOTE — Progress Notes (Signed)
Pre visit review using our clinic review tool, if applicable. No additional management support is needed unless otherwise documented below in the visit note. 

## 2015-03-25 NOTE — Patient Instructions (Addendum)
You will be contacted about your mammogram and physical therapy referral. Please complete lab work prior to leaving. Work on [employer], exercise, weight loss.  Add toprol xl 25mg  once daily for blood pressure. Welcome to [facility]!

## 2015-03-25 NOTE — Assessment & Plan Note (Signed)
Trial of meloxicam, PT, weight loss. Consider further imaging if symptoms worsen or do not improve.

## 2015-03-25 NOTE — Progress Notes (Signed)
Subjective:    Patient ID: Amy Vincent, female    DOB: 1956/04/27, 59 y.o.   MRN: [id number]  HPI  Amy Vincent is a 59 yr old female who presents today to establish care.    Pmhx is significant for the following:  HTN- Reports that she was diagnosed with HTN around 2003. She is on diltiazem and losartan-hctz.    Hyperlipidemia- Has never been on lipid lowering medication.    Her primary concerns today include:  Chronic low back pain- reports hx of low back pain since 2009.  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  Reports pain is worst in the lower back.  Some days has trouble getting out of bed sometimes due to the pain.   She is on disability due to back pain and left knee pain. Placed on disability 2011.  She reports that the pain in her back is non-radiating.  Pain improves with walking.  In the past she has been on hydrocodone in the past but this did not help much so she stopped.   Review of Systems  Constitutional: Negative for unexpected weight change.  HENT: Negative for hearing loss.   Eyes: Negative for visual disturbance.  Respiratory: Negative for cough.   Cardiovascular: Negative for leg swelling.  Gastrointestinal: Negative for diarrhea and constipation.  Genitourinary: Negative for dysuria and frequency.  Musculoskeletal: Positive for arthralgias.  Skin: Negative for rash.  Neurological: Negative for headaches.  Hematological: Negative for adenopathy.  Psychiatric/Behavioral:       Denies anxiety, occasional depression sx's due to finances   Past Medical History  Diagnosis Date  . Hypertension   . High cholesterol     Social History   Social History  . Marital Status: Single    Spouse Name: N/A  . Number of Children: N/A  . Years of Education: N/A   Occupational History  . Not on file.   Social History Main Topics  . Smoking status: Former Smoker    Quit date: 05/09/1994  . Smokeless tobacco: Never  Used  . Alcohol Use: 1.8 oz/week    3 Standard drinks or equivalent per week  . Drug Use: No  . Sexual Activity: Not on file   Other Topics Concern  . Not on file   Social History Narrative   No children   On disability   Originally from [place]   Single, sister and brother liv   Lives with girlfriend    Enjoys gardening, antique "flips"    Past Surgical History  Procedure Laterality Date  . Incision and drainage      I & D of an abscess on back  . Abdominal hysterectomy  1999  . Left arm surgery  2004    orthopedic surgery left arm due to fracture as a child that healed improperly    Family History  Problem Relation Age of Onset  . Cancer Mother 67    breast  . Diabetes Father   . Cancer Maternal Aunt 26    breast  . Hypertension Paternal Uncle   . Cancer Maternal Aunt     stomach    No Known Allergies  Current Outpatient Prescriptions on File Prior to Visit  Medication Sig Dispense Refill  . losartan-hydrochlorothiazide (HYZAAR) 100-25 MG per tablet Take 1 tablet by mouth daily.     No current facility-administered medications on file prior to visit.    BP 155/82 mmHg  Pulse 70  Temp(Src) 98.4 F (36.9 C) (Oral)  Resp 18  Ht 5\' 4"  (1.626 m)  Wt 183 lb 3.2 oz (83.099 kg)  BMI 31.43 kg/m2  SpO2 100%       Objective:   Physical Exam  Constitutional: She is oriented to person, place, and time. She appears well-developed and well-nourished.  HENT:  Head: Normocephalic and atraumatic.  Cardiovascular: Normal rate, regular rhythm and normal heart sounds.   No murmur heard. Pulmonary/Chest: Effort normal and breath sounds normal. No respiratory distress. She has no wheezes.  Abdominal: Soft. Bowel sounds are normal. She exhibits no distension. There is no tenderness. There is no rebound.  Musculoskeletal: She exhibits no edema.  Bilateral LE strength is 5/5  Neurological: She is alert and oriented to person, place, and time.  Reflex Scores:      Patellar  reflexes are 2+ on the right side. Skin: Skin is warm and dry.  Psychiatric: She has a normal mood and affect. Her behavior is normal. Judgment and thought content normal.          Assessment & Plan:

## 2015-03-25 NOTE — Assessment & Plan Note (Signed)
Obtain lipid panel to assess lipid status.

## 2015-03-25 NOTE — Assessment & Plan Note (Signed)
Uncontrolled.  Add toprol xl 25mg  once daily.

## 2015-03-26 ENCOUNTER — Encounter: Payer: Self-pay | Admitting: Family

## 2015-03-30 NOTE — Telephone Encounter (Signed)
Pre Visit call completed. 

## 2015-04-01 ENCOUNTER — Ambulatory Visit: Payer: Medicare Other | Admitting: Physical Therapy

## 2015-04-13 ENCOUNTER — Ambulatory Visit: Payer: Commercial Managed Care - HMO | Attending: Family | Admitting: Physical Therapy

## 2015-04-13 DIAGNOSIS — R29898 Other symptoms and signs involving the musculoskeletal system: Secondary | ICD-10-CM | POA: Insufficient documentation

## 2015-04-13 DIAGNOSIS — R293 Abnormal posture: Secondary | ICD-10-CM | POA: Diagnosis not present

## 2015-04-13 DIAGNOSIS — M545 Low back pain, unspecified: Secondary | ICD-10-CM

## 2015-04-13 NOTE — Therapy (Signed)
[place], [place], [address] Phone: [phone]   Fax:  [phone]  Physical Therapy Evaluation  Patient Details  Name: Amy Vincent MRN: [id number] Date of Birth: 11/01/1955 Referring Provider: Debbrah Alar, NP  Encounter Date: 04/13/2015      PT End of Session - 04/13/15 1048    Visit Number 1   Number of Visits 12   Date for PT Re-Evaluation 05/25/15   PT Start Time 1001   PT Stop Time 1058   PT Time Calculation (min) 57 min   Activity Tolerance Patient tolerated treatment well   Behavior During Therapy [facility] for tasks assessed/performed      Past Medical History  Diagnosis Date  . Hypertension   . High cholesterol     Past Surgical History  Procedure Laterality Date  . Incision and drainage      I & D of an abscess on back  . Abdominal hysterectomy  1999  . Left arm surgery  2004    orthopedic surgery left arm due to fracture as a child that healed improperly    There were no vitals filed for this visit.  Visit Diagnosis:  Midline low back pain without sciatica - Plan: PT plan of care cert/re-cert  Weakness of both lower extremities - Plan: PT plan of care cert/re-cert  Abnormal posture - Plan: PT plan of care cert/re-cert      Subjective Assessment - 04/13/15 1005    Subjective Pt is a 59 y/o female who presents to OPPT with chronic LBP x 16 years.  Pt reports pain has increased since being out of work  (2011).  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     Pertinent History L elbow surgery   Limitations Sitting;Standing;Walking;House hold activities   How long can you sit comfortably? 20 min   How long can you stand comfortably? 5-8 min   How long can you walk comfortably? unable "I don't walk at all"  pt states able to tolerate walking to car, etc but unable to walk for exercise   Patient Stated Goals improve pain; move furniture  by self, dance   Currently in Pain? Yes   Pain Score 8    Pain Location Back   Pain Orientation Lower;Left;Right   Pain Descriptors / Indicators Sharp;Crying;Throbbing   Pain Type Chronic pain   Pain Radiating Towards up spine   Pain Onset More than a month ago   Pain Frequency Intermittent   Aggravating Factors  prolonged static position, bending, lifting (groceries)   Pain Relieving Factors repositioning, meds            [facility] PT Assessment - 04/13/15 1013    Assessment   Medical Diagnosis LBP   Referring Provider Debbrah Alar, NP   Onset Date/Surgical Date --  16 years chronic pain   Prior Therapy none for Back   Precautions   Precautions None   Restrictions   Weight Bearing Restrictions No   Balance Screen   Has the patient fallen in the past 6 months Yes   How many times? 1   Has the patient had a decrease in activity level because of a fear of falling?  No   Is the patient reluctant to leave their home because of a fear of falling?  No   Home Environment   Living Environment Private residence   Living Arrangements Non-relatives/Friends   Type of [place]  Home Access Stairs to enter   Entrance Stairs-Number of Steps 2   Entrance Stairs-Rails Can reach both   Home Layout One level   Prior Function   Level of Independence Independent   Vocation On disability   Leisure bowling   Observation/Other Assessments   Focus on Therapeutic Outcomes (FOTO)  pt declined   Posture/Postural Control   Posture/Postural Control Postural limitations   Postural Limitations Increased lumbar lordosis;Rounded Shoulders;Forward head   AROM   AROM Assessment Site Lumbar   Lumbar Flexion 28   Lumbar Extension 11   Lumbar - Right Side Bend 15   Lumbar - Left Side Bend 10  pain to L   Strength   Strength Assessment Site Hip;Knee;Ankle   Right Hip Flexion 4/5   Right Hip Extension 3/5   Right Hip ABduction 3+/5   Right Hip ADduction 3/5   Left Hip Flexion 3+/5   Left  Hip Extension 3/5   Left Hip ABduction 3+/5   Left Hip ADduction 3/5   Right/Left Knee Right;Left   Right Knee Flexion 4/5   Right Knee Extension 5/5   Left Knee Flexion 3+/5   Left Knee Extension 4/5   Right Ankle Dorsiflexion 5/5   Left Ankle Dorsiflexion 5/5   Flexibility   Soft Tissue Assessment /Muscle Length yes   Hamstrings tightness noted bil L>R   ITB tightness bil    Piriformis tightness bil L>R   Palpation   Palpation comment pt very tender to light palpation along lumbar spine and piriformis; pt did state some anxiety and fear of pain rather than true tenderness   Ambulation/Gait   Gait Comments pt ambulates independently without deviations however reports pain with mobility                   [facility] Adult PT Treatment/Exercise - 04/13/15 1013    Modalities   Modalities Electrical Stimulation;Moist Heat   Moist Heat Therapy   Number Minutes Moist Heat 15 Minutes   Moist Heat Location Lumbar Spine   Electrical Stimulation   Electrical Stimulation Location lumbar spine   Electrical Stimulation Action IFC   Electrical Stimulation Parameters to tolerance (10 mA)   Electrical Stimulation Goals Pain                PT Education - 04/13/15 1048    Education provided Yes   Education Details clinical findings; POC and goals of care   Person(s) Educated Patient   Methods Explanation   Comprehension Verbalized understanding          PT Short Term Goals - 04/13/15 1051    PT SHORT TERM GOAL #1   Title independent with HEP (05/04/15)   Time 3   Period Weeks   Status New           PT Long Term Goals - 04/13/15 1052    PT LONG TERM GOAL #1   Title verbalize understanding of posture/body mechanics to reduce risk of reinjury (05/25/15)   Time 6   Period Weeks   Status New   PT LONG TERM GOAL #2   Title report ability to ambulate 15 min without increase in pain for improved function (05/25/15)   Time 6   Period Weeks   Status New   PT LONG  TERM GOAL #3   Title tolerate standing > 15 min without increase in pain for improved function and ability to perform housework (05/25/15)   Time 6   Period Weeks  Status New   PT LONG TERM GOAL #4   Title improve bil LE strength to at least 4/5 for improved function (05/25/15)   Time 6   Period Weeks   Status New               Plan - 2015/05/03 1049    Clinical Impression Statement Pt is a 59 y/o female who presents to OPPT with 16 year hx of LBP affecting functional mobility.  Pt demonstrates decreased lumbar ROM and decreased core and lower extremity strength as well as significant tenderness to light palpation.  Pt will benefit from PT to maximize function and address deficits listed above.    Pt will benefit from skilled therapeutic intervention in order to improve on the following deficits Decreased strength;Decreased range of motion;Pain;Decreased activity tolerance;Decreased endurance;Improper body mechanics;Postural dysfunction;Impaired perceived functional ability   Rehab Potential Good   PT Frequency 2x / week   PT Duration 6 weeks   PT Treatment/Interventions ADLs/Self Care Home Management;Cryotherapy;Electrical Stimulation;Therapeutic exercise;Moist Heat;Therapeutic activities;Functional mobility training;Stair training;Gait training;Ultrasound;Traction;Patient/family education;Manual techniques;Taping   PT Next Visit Plan HEP for lumbar flexibility, core and hip strengthening   Consulted and Agree with Plan of Care Patient          G-Codes - 05/03/15 1055    Functional Assessment Tool Used clinical judgement: inability to walk for exercise; standing tolerance 5-8 min   Functional Limitation Mobility: Walking and moving around   Mobility: Walking and Moving Around Current Status [id number]) At least 40 percent but less than 60 percent impaired, limited or restricted   Mobility: Walking and Moving Around Goal Status [phone]) At least 20 percent but less than 40 percent  impaired, limited or restricted       Problem List Patient Active Problem List   Diagnosis Date Noted  . Chronic low back pain 03/25/2015  . HTN (hypertension) 03/25/2015  . Hyperlipidemia 03/25/2015   Laureen Abrahams, PT, DPT 05/03/2015 10:58 AM  [facility] [address] [place], [place], [address] Phone: [phone]   Fax:  [phone]  Name: Amy Vincent MRN: [id number] Date of Birth: April 29, 1956

## 2015-04-16 ENCOUNTER — Ambulatory Visit: Payer: Commercial Managed Care - HMO | Admitting: Physical Therapy

## 2015-04-16 DIAGNOSIS — M545 Low back pain, unspecified: Secondary | ICD-10-CM

## 2015-04-16 DIAGNOSIS — R29898 Other symptoms and signs involving the musculoskeletal system: Secondary | ICD-10-CM | POA: Diagnosis not present

## 2015-04-16 DIAGNOSIS — R293 Abnormal posture: Secondary | ICD-10-CM | POA: Diagnosis not present

## 2015-04-16 NOTE — Therapy (Signed)
[place], [place], [address] Phone: [phone]   Fax:  [phone]  Physical Therapy Treatment  Patient Details  Name: JUVENTINA DOBRY MRN: [id number] Date of Birth: 12-Jun-1955 Referring Provider: Debbrah Alar, NP  Encounter Date: 04/16/2015      PT End of Session - 04/16/15 0851    Visit Number 2   Number of Visits 12   Date for PT Re-Evaluation 05/25/15   PT Start Time 0845   PT Stop Time 0942   PT Time Calculation (min) 57 min      Past Medical History  Diagnosis Date  . Hypertension   . High cholesterol     Past Surgical History  Procedure Laterality Date  . Incision and drainage      I & D of an abscess on back  . Abdominal hysterectomy  1999  . Left arm surgery  2004    orthopedic surgery left arm due to fracture as a child that healed improperly    There were no vitals filed for this visit.  Visit Diagnosis:  Midline low back pain without sciatica  Weakness of both lower extremities  Abnormal posture      Subjective Assessment - 04/16/15 0847    Subjective I am a little sore today.    Currently in Pain? Yes   Pain Score 8    Pain Location Back   Pain Orientation Lower;Left;Right   Pain Descriptors / Indicators Sore   Pain Type Chronic pain                         [facility] Adult PT Treatment/Exercise - 04/16/15 0913    Self-Care   Self-Care Other Self-Care Comments   Other Self-Care Comments  Log roll for supine to sit x 2  as well as benefit of correct posture   Lumbar Exercises: Stretches   Active Hamstring Stretch 3 reps;30 seconds   Single Knee to Chest Stretch 3 reps;30 seconds   Lower Trunk Rotation 3 reps;30 seconds   Piriformis Stretch 3 reps;30 seconds   Lumbar Exercises: Aerobic   Stationary Bike Nustep L4 UE/LE x 5 minutes   Lumbar Exercises: Supine   Ab Set 10 reps   Modalities   Modalities Electrical Stimulation   Moist Heat  Therapy   Number Minutes Moist Heat 15 Minutes   Moist Heat Location Lumbar Spine   Electrical Stimulation   Electrical Stimulation Location lumbar    Electrical Stimulation Action IFC   Electrical Stimulation Parameters to tolerance    Electrical Stimulation Goals Pain                PT Education - 04/16/15 0908    Education provided Yes   Education Details Lumbar flexibility: knee to chest, lower trunk rotation, figure 4 piriformis stretch, hamstring stretch   Person(s) Educated Patient   Methods Explanation;Handout   Comprehension Verbalized understanding          PT Short Term Goals - 04/16/15 0912    PT SHORT TERM GOAL #1   Title independent with HEP (05/04/15)   Time 3   Period Weeks   Status On-going           PT Long Term Goals - 04/16/15 0913    PT LONG TERM GOAL #1   Title verbalize understanding of posture/body mechanics to reduce risk of reinjury (05/25/15)   Time 6   Period Weeks   Status On-going  PT LONG TERM GOAL #2   Title report ability to ambulate 15 min without increase in pain for improved function (05/25/15)   Time 6   Period Weeks   Status On-going   PT LONG TERM GOAL #3   Title tolerate standing > 15 min without increase in pain for improved function and ability to perform housework (05/25/15)   Time 6   Period Weeks   Status On-going   PT LONG TERM GOAL #4   Title improve bil LE strength to at least 4/5 for improved function (05/25/15)   Time 6   Period Weeks   Status On-going               Plan - 04/16/15 0909    Clinical Impression Statement Pt reports more pain after estim last visit but willing to try it again. Instructed pt in HEP for lumbar flexibility. Will add core and hip strength next session. No increased pain with therex today,    PT Next Visit Plan review HEP for lumbar flexibility,  add core and hip strengthening; assess benefit of estim   PT Home Exercise Plan  knee to chest, lower trunk rotation, figure  4 piriformis stretch, hamstring stretch; ab set        Problem List Patient Active Problem List   Diagnosis Date Noted  . Chronic low back pain 03/25/2015  . HTN (hypertension) 03/25/2015  . Hyperlipidemia 03/25/2015    Dorene Ar, PTA 04/16/2015, 9:30 AM  [place], [place], [address] Phone: [phone]   Fax:  [phone]  Name: ELISAVET WEATHERSBY MRN: [id number] Date of Birth: [birth year]

## 2015-04-16 NOTE — Patient Instructions (Signed)
Knee to Chest (Flexion)   Pull knee toward chest. Feel stretch in lower back or buttock area. Breathing deeply, Hold _30___ seconds. Repeat with other knee. Repeat _3___ times on each leg. Do __2__ sessions per day.  Isometric Abdominal   Lying on back with knees bent, tighten stomach by pressing elbows down. Hold __5__ seconds. Repeat __10__ times per set. Do __2__ sets per session. Do __2__ sessions per day.  Lumbar Rotation: Caudal - Bilateral (Supine)   Feet and knees together, arms outstretched, let knees fall to one side ,relaxing muscles of low back. Perform 30 seconds on each side.  Repeat _3___ times per set.  Do __2__ sessions per day.  Hamstring Stretch    With other leg bent, foot flat, grasp right leg and slowly try to straighten knee. Hold _30___ seconds. Repeat __3__ times. Do _2___ sessions per day.  http://gt2.exer.us/280   Copyright  VHI. All rights reserved.   Piriformis Stretch, Supine    Lie supine, one ankle crossed onto opposite knee. Holding bottom leg behind knee, gently pull legs toward chest until stretch is felt in buttock of top leg. Hold __30_ seconds. For deeper stretch gently push top knee away from body.  Repeat _3__ times per session. Do _2__ sessions per day.  Copyright  VHI. All rights reserved.

## 2015-04-18 DIAGNOSIS — I10 Essential (primary) hypertension: Secondary | ICD-10-CM | POA: Diagnosis not present

## 2015-04-18 DIAGNOSIS — Z0000 Encounter for general adult medical examination without abnormal findings: Secondary | ICD-10-CM | POA: Diagnosis not present

## 2015-04-18 DIAGNOSIS — Z6831 Body mass index (BMI) 31.0-31.9, adult: Secondary | ICD-10-CM | POA: Diagnosis not present

## 2015-04-22 ENCOUNTER — Ambulatory Visit: Payer: Commercial Managed Care - HMO | Admitting: Physical Therapy

## 2015-04-22 DIAGNOSIS — M545 Low back pain, unspecified: Secondary | ICD-10-CM

## 2015-04-22 DIAGNOSIS — R293 Abnormal posture: Secondary | ICD-10-CM | POA: Diagnosis not present

## 2015-04-22 DIAGNOSIS — R29898 Other symptoms and signs involving the musculoskeletal system: Secondary | ICD-10-CM

## 2015-04-22 NOTE — Therapy (Signed)
[place], [place], [address] Phone: [phone]   Fax:  [phone]  Physical Therapy Treatment  Patient Details  Name: Amy Vincent MRN: [id number] Date of Birth: 01/29/1956 Referring Provider: Debbrah Alar, NP  Encounter Date: 04/22/2015      PT End of Session - 04/22/15 1407    Visit Number 3   Number of Visits 12   Date for PT Re-Evaluation 05/25/15   PT Start Time 1330   PT Stop Time 1420   PT Time Calculation (min) 50 min   Activity Tolerance Patient tolerated treatment well   Behavior During Therapy [facility] for tasks assessed/performed      Past Medical History  Diagnosis Date  . Hypertension   . High cholesterol     Past Surgical History  Procedure Laterality Date  . Incision and drainage      I & D of an abscess on back  . Abdominal hysterectomy  1999  . Left arm surgery  2004    orthopedic surgery left arm due to fracture as a child that healed improperly    There were no vitals filed for this visit.  Visit Diagnosis:  Midline low back pain without sciatica  Weakness of both lower extremities  Abnormal posture      Subjective Assessment - 04/22/15 1334    Subjective Hurting in mid back today; stood for 2.5 hours doing a friends hair.   Patient Stated Goals improve pain; move furniture by self, dance   Currently in Pain? Yes   Pain Score 8    Pain Location Back   Pain Orientation Mid;Right;Left   Pain Descriptors / Indicators Aching   Pain Type Chronic pain   Pain Onset More than a month ago   Pain Frequency Intermittent   Aggravating Factors  prolonged positioning, bending, lifting (groceries)   Pain Relieving Factors repositioning, meds                         [facility] Adult PT Treatment/Exercise - 04/22/15 1337    Lumbar Exercises: Stretches   Single Knee to Chest Stretch 3 reps;30 seconds   Lower Trunk Rotation 3 reps;30 seconds   Lumbar  Exercises: Aerobic   Stationary Bike Nustep L5 UE/LE x 8 minutes   Lumbar Exercises: Supine   Ab Set 10 reps   Clam 10 reps   Clam Limitations with ab set   Bent Knee Raise 10 reps   Bent Knee Raise Limitations with ab set   Bridge Limitations attempted; pt unable to perform due to pain   Moist Heat Therapy   Number Minutes Moist Heat 15 Minutes   Moist Heat Location Lumbar Spine   Electrical Stimulation   Electrical Stimulation Location lumbar    Electrical Stimulation Action IFC   Electrical Stimulation Parameters to tolerance; in sitting   Electrical Stimulation Goals Pain                  PT Short Term Goals - 04/16/15 0912    PT SHORT TERM GOAL #1   Title independent with HEP (05/04/15)   Time 3   Period Weeks   Status On-going           PT Long Term Goals - 04/16/15 0913    PT LONG TERM GOAL #1   Title verbalize understanding of posture/body mechanics to reduce risk of reinjury (05/25/15)   Time 6   Period Weeks  Status On-going   PT LONG TERM GOAL #2   Title report ability to ambulate 15 min without increase in pain for improved function (05/25/15)   Time 6   Period Weeks   Status On-going   PT LONG TERM GOAL #3   Title tolerate standing > 15 min without increase in pain for improved function and ability to perform housework (05/25/15)   Time 6   Period Weeks   Status On-going   PT LONG TERM GOAL #4   Title improve bil LE strength to at least 4/5 for improved function (05/25/15)   Time 6   Period Weeks   Status On-going               Plan - 04/22/15 1407    Clinical Impression Statement Pt stated estim painful due to positioning so tried sitting position today.  Unable to tolerate bridges but tolerated core strengthening well.   PT Next Visit Plan review HEP for lumbar flexibility,  add core and hip strengthening; assess benefit of estim   PT Home Exercise Plan  knee to chest, lower trunk rotation, figure 4 piriformis stretch, hamstring  stretch; ab set        Problem List Patient Active Problem List   Diagnosis Date Noted  . Chronic low back pain 03/25/2015  . HTN (hypertension) 03/25/2015  . Hyperlipidemia 03/25/2015   Laureen Abrahams, PT, DPT 04/22/2015 2:22 PM  [facility] [address] [place], [place], [address] Phone: [phone]   Fax:  [phone]  Name: Amy Vincent MRN: [id number] Date of Birth: 1955-12-13

## 2015-04-24 ENCOUNTER — Ambulatory Visit (INDEPENDENT_AMBULATORY_CARE_PROVIDER_SITE_OTHER): Payer: Commercial Managed Care - HMO | Admitting: Family

## 2015-04-24 ENCOUNTER — Ambulatory Visit: Payer: Commercial Managed Care - HMO | Admitting: Physical Therapy

## 2015-04-24 ENCOUNTER — Encounter: Payer: Self-pay | Admitting: Family

## 2015-04-24 VITALS — BP 132/78 | HR 61 | Temp 98.0°F | Resp 18 | Ht 64.0 in | Wt 183.2 lb

## 2015-04-24 DIAGNOSIS — R110 Nausea: Secondary | ICD-10-CM

## 2015-04-24 DIAGNOSIS — F329 Major depressive disorder, single episode, unspecified: Secondary | ICD-10-CM

## 2015-04-24 DIAGNOSIS — I10 Essential (primary) hypertension: Secondary | ICD-10-CM

## 2015-04-24 DIAGNOSIS — R5382 Chronic fatigue, unspecified: Secondary | ICD-10-CM

## 2015-04-24 DIAGNOSIS — R232 Flushing: Secondary | ICD-10-CM

## 2015-04-24 DIAGNOSIS — F32A Depression, unspecified: Secondary | ICD-10-CM | POA: Insufficient documentation

## 2015-04-24 DIAGNOSIS — N951 Menopausal and female climacteric states: Secondary | ICD-10-CM

## 2015-04-24 LAB — CBC WITH DIFFERENTIAL/PLATELET
BASOS ABS: 0.1 10*3/uL (ref 0.0–0.1)
Basophils Relative: 0.8 % (ref 0.0–3.0)
Eosinophils Absolute: 0.6 10*3/uL (ref 0.0–0.7)
Eosinophils Relative: 7.4 % — ABNORMAL HIGH (ref 0.0–5.0)
HEMATOCRIT: 44.3 % (ref 36.0–46.0)
Hemoglobin: 14.9 g/dL (ref 12.0–15.0)
LYMPHS PCT: 32.1 % (ref 12.0–46.0)
Lymphs Abs: 2.7 10*3/uL (ref 0.7–4.0)
MCHC: 33.6 g/dL (ref 30.0–36.0)
MCV: 89.5 fl (ref 78.0–100.0)
Monocytes Absolute: 1 10*3/uL (ref 0.1–1.0)
Monocytes Relative: 12.3 % — ABNORMAL HIGH (ref 3.0–12.0)
NEUTROS ABS: 3.9 10*3/uL (ref 1.4–7.7)
Neutrophils Relative %: 47.4 % (ref 43.0–77.0)
Platelets: 243 10*3/uL (ref 150.0–400.0)
RBC: 4.94 Mil/uL (ref 3.87–5.11)
RDW: 12.8 % (ref 11.5–15.5)
WBC: 8.3 10*3/uL (ref 4.0–10.5)

## 2015-04-24 LAB — TSH: TSH: 0.79 u[IU]/mL (ref 0.35–4.50)

## 2015-04-24 MED ORDER — VENLAFAXINE HCL ER 37.5 MG PO CP24: ORAL_CAPSULE | ORAL | Status: DC

## 2015-04-24 NOTE — Progress Notes (Signed)
Subjective:    Patient ID: Amy Vincent, female    DOB: 1955/08/28, 59 y.o.   MRN: [id number]  HPI   Amy Vincent is a 59 yr old female who presents today for follow up.  1) HTN- maintained on hyzaar, toprol xl.   BP Readings from Last 3 Encounters:  04/24/15 132/78  03/25/15 155/82  09/04/14 133/89   2)  Nausea-  + nausea today. + gagging this AM.  She drank some coffee this AM, kept it down.    3) Back pain- she is undergoing PT. Did not start meloxicam due to concerns re: side effects.   Feels tearful. Feels depressed.  Reports poor sex drive, no motivation.      Review of Systems  Respiratory: Negative for cough and shortness of breath.   Cardiovascular: Negative for leg swelling.  reports no sex drive, + hot flashes.  + fatigue     Past Medical History  Diagnosis Date  . Hypertension   . High cholesterol     Social History   Social History  . Marital Status: Single    Spouse Name: N/A  . Number of Children: N/A  . Years of Education: N/A   Occupational History  . Not on file.   Social History Main Topics  . Smoking status: Former Smoker    Quit date: 05/09/1994  . Smokeless tobacco: Never Used  . Alcohol Use: 1.8 oz/week    3 Standard drinks or equivalent per week  . Drug Use: No  . Sexual Activity: Not on file   Other Topics Concern  . Not on file   Social History Narrative   No children   On disability   Originally from [place]   Single, sister and brother liv   Lives with girlfriend    Enjoys gardening, antique "flips"    Past Surgical History  Procedure Laterality Date  . Incision and drainage      I & D of an abscess on back  . Abdominal hysterectomy  1999  . Left arm surgery  2004    orthopedic surgery left arm due to fracture as a child that healed improperly    Family History  Problem Relation Age of Onset  . Cancer Mother 73    breast  . Diabetes Father   . Cancer Maternal Aunt 50    breast  . Hypertension Paternal Uncle     . Cancer Maternal Aunt     stomach    No Known Allergies  Current Outpatient Prescriptions on File Prior to Visit  Medication Sig Dispense Refill  . diltiazem (DILACOR XR) 240 MG 24 hr capsule Take 1 capsule by mouth daily.    Marland Kitchen losartan-hydrochlorothiazide (HYZAAR) 100-25 MG per tablet Take 1 tablet by mouth daily.    . metoprolol succinate (TOPROL-XL) 25 MG 24 hr tablet Take 1 tablet (25 mg total) by mouth daily. 30 tablet 3   No current facility-administered medications on file prior to visit.    BP 132/78 mmHg  Pulse 61  Temp(Src) 98 F (36.7 C) (Oral)  Resp 18  Ht 5\' 4"  (1.626 m)  Wt 183 lb 3.2 oz (83.099 kg)  BMI 31.43 kg/m2  SpO2 100%    Objective:   Physical Exam  Constitutional: She is oriented to person, place, and time. She appears well-developed and well-nourished.  HENT:  Head: Normocephalic and atraumatic.  Cardiovascular: Normal rate, regular rhythm and normal heart sounds.   No murmur heard. Pulmonary/Chest: Effort normal  and breath sounds normal. No respiratory distress. She has no wheezes.  Musculoskeletal: She exhibits no edema.  Neurological: She is alert and oriented to person, place, and time.  Psychiatric: Her behavior is normal. Judgment and thought content normal.  + tearfulness          Assessment & Plan:  Nausea- ? Mild GI illness. Tolerating PO's  Monitor.   Fatigue- obtain cbc, tsh.    Hot flashes- trial of effexor for depression and hot flashes

## 2015-04-24 NOTE — Patient Instructions (Signed)
Start effexor to help with depression and hot flashes. Complete lab work prior to leaving.

## 2015-04-24 NOTE — Progress Notes (Signed)
Pre visit review using our clinic review tool, if applicable. No additional management support is needed unless otherwise documented below in the visit note.,mh

## 2015-04-24 NOTE — Assessment & Plan Note (Signed)
BP stable on current meds. Toprol could certainly be contributing to her fatigue. Will consider changing next visit if labs unremarkable and if she is not feeling better.

## 2015-04-27 ENCOUNTER — Ambulatory Visit: Payer: Commercial Managed Care - HMO | Admitting: Physical Therapy

## 2015-04-27 DIAGNOSIS — M545 Low back pain, unspecified: Secondary | ICD-10-CM

## 2015-04-27 DIAGNOSIS — R293 Abnormal posture: Secondary | ICD-10-CM | POA: Diagnosis not present

## 2015-04-27 DIAGNOSIS — R29898 Other symptoms and signs involving the musculoskeletal system: Secondary | ICD-10-CM | POA: Diagnosis not present

## 2015-04-27 NOTE — Therapy (Addendum)
[place], [place], [address] Phone: [phone]   Fax:  [phone]  Physical Therapy Treatment  Patient Details  Name: Amy Vincent MRN: [id number] Date of Birth: 21-Feb-1956 Referring Provider: Debbrah Alar, NP  Encounter Date: 04/27/2015      PT End of Session - 04/27/15 1015    Visit Number 4   Number of Visits 12   Date for PT Re-Evaluation 05/25/15   PT Start Time 0930   PT Stop Time 4315   PT Time Calculation (min) 59 min   Activity Tolerance Patient tolerated treatment well;No increased pain   Behavior During Therapy [facility] for tasks assessed/performed      Past Medical History  Diagnosis Date  . Hypertension   . High cholesterol     Past Surgical History  Procedure Laterality Date  . Incision and drainage      I & D of an abscess on back  . Abdominal hysterectomy  1999  . Left arm surgery  2004    orthopedic surgery left arm due to fracture as a child that healed improperly    There were no vitals filed for this visit.  Visit Diagnosis:  Midline low back pain without sciatica  Weakness of both lower extremities  Abnormal posture      Subjective Assessment - 04/27/15 0935    Subjective Didn't do anything this weekend; did some exercises.  No pain though today.   Patient Stated Goals improve pain; move furniture by self, dance   Currently in Pain? No/denies                         [facility] Adult PT Treatment/Exercise - 04/27/15 0937    Lumbar Exercises: Stretches   Active Hamstring Stretch 3 reps;30 seconds   Active Hamstring Stretch Limitations with strap   Single Knee to Chest Stretch 3 reps;30 seconds   Lower Trunk Rotation 3 reps;30 seconds   Lumbar Exercises: Aerobic   Stationary Bike Nustep L5 UE/LE x 8 minutes   Lumbar Exercises: Prone   Single Arm Raise Right;Left;10 reps   Straight Leg Raise 10 reps   Modalities   Modalities Electrical  Stimulation;Moist Heat   Moist Heat Therapy   Number Minutes Moist Heat 15 Minutes   Moist Heat Location Lumbar Spine   Electrical Stimulation   Electrical Stimulation Location lumbar    Electrical Stimulation Action IFC   Electrical Stimulation Parameters to tolerance   Electrical Stimulation Goals Pain                  PT Short Term Goals - 04/16/15 0912    PT SHORT TERM GOAL #1   Title independent with HEP (05/04/15)   Time 3   Period Weeks   Status On-going           PT Long Term Goals - 04/16/15 0913    PT LONG TERM GOAL #1   Title verbalize understanding of posture/body mechanics to reduce risk of reinjury (05/25/15)   Time 6   Period Weeks   Status On-going   PT LONG TERM GOAL #2   Title report ability to ambulate 15 min without increase in pain for improved function (05/25/15)   Time 6   Period Weeks   Status On-going   PT LONG TERM GOAL #3   Title tolerate standing > 15 min without increase in pain for improved function and ability to perform housework (05/25/15)  Time 6   Period Weeks   Status On-going   PT LONG TERM GOAL #4   Title improve bil LE strength to at least 4/5 for improved function (05/25/15)   Time 6   Period Weeks   Status On-going               Plan - 04/27/15 1015    Clinical Impression Statement Pt progressing well without pain today.  If continues to be pain free and good HEP established, will be ready for d/c next 1-2 weeks.     PT Next Visit Plan cont core/hip strengthening; update HEP as needed   PT Home Exercise Plan  knee to chest, lower trunk rotation, figure 4 piriformis stretch, hamstring stretch; ab set   Consulted and Agree with Plan of Care Patient        Problem List Patient Active Problem List   Diagnosis Date Noted  . Depression 04/24/2015  . Chronic low back pain 03/25/2015  . HTN (hypertension) 03/25/2015  . Hyperlipidemia 03/25/2015   Laureen Abrahams, PT, DPT 04/27/2015 10:39 AM  [facility] [address] [place], [place], [address] Phone: [phone]   Fax:  [phone]  Name: Amy Vincent MRN: [id number] Date of Birth: Aug 18, 1955      PHYSICAL THERAPY DISCHARGE SUMMARY  Visits from Start of Care: 4  Current functional level related to goals / functional outcomes: See above; pt did not return   Remaining deficits: unknown   Education / Equipment: HEP  Plan: Patient agrees to discharge.  Patient goals were not met. Patient is being discharged due to not returning since the last visit.  ?????    Laureen Abrahams, PT, DPT 07/16/2015 3:23 PM  [facility] [address], [place] [address]  [phone] (office) [phone] (fax)

## 2015-04-29 ENCOUNTER — Ambulatory Visit: Payer: Commercial Managed Care - HMO

## 2015-05-01 ENCOUNTER — Emergency Department (HOSPITAL_COMMUNITY): Payer: No Typology Code available for payment source

## 2015-05-01 ENCOUNTER — Emergency Department (HOSPITAL_COMMUNITY)
Admission: EM | Admit: 2015-05-01 | Discharge: 2015-05-01 | Disposition: A | Discharge status: Home / Self Care | Payer: No Typology Code available for payment source | Attending: Emergency Medicine | Admitting: Emergency Medicine

## 2015-05-01 ENCOUNTER — Encounter (HOSPITAL_COMMUNITY): Payer: Self-pay | Admitting: Emergency Medicine

## 2015-05-01 DIAGNOSIS — S3690XA Unspecified injury of unspecified intra-abdominal organ, initial encounter: Secondary | ICD-10-CM | POA: Diagnosis not present

## 2015-05-01 DIAGNOSIS — Z8639 Personal history of other endocrine, nutritional and metabolic disease: Secondary | ICD-10-CM | POA: Diagnosis not present

## 2015-05-01 DIAGNOSIS — S301XXA Contusion of abdominal wall, initial encounter: Secondary | ICD-10-CM | POA: Diagnosis not present

## 2015-05-01 DIAGNOSIS — S20212A Contusion of left front wall of thorax, initial encounter: Secondary | ICD-10-CM | POA: Diagnosis not present

## 2015-05-01 DIAGNOSIS — Y92410 Unspecified street and highway as the place of occurrence of the external cause: Secondary | ICD-10-CM | POA: Insufficient documentation

## 2015-05-01 DIAGNOSIS — R1012 Left upper quadrant pain: Secondary | ICD-10-CM | POA: Diagnosis not present

## 2015-05-01 DIAGNOSIS — Z79899 Other long term (current) drug therapy: Secondary | ICD-10-CM | POA: Insufficient documentation

## 2015-05-01 DIAGNOSIS — Y998 Other external cause status: Secondary | ICD-10-CM | POA: Diagnosis not present

## 2015-05-01 DIAGNOSIS — I10 Essential (primary) hypertension: Secondary | ICD-10-CM | POA: Insufficient documentation

## 2015-05-01 DIAGNOSIS — R109 Unspecified abdominal pain: Secondary | ICD-10-CM | POA: Diagnosis not present

## 2015-05-01 DIAGNOSIS — Z9049 Acquired absence of other specified parts of digestive tract: Secondary | ICD-10-CM | POA: Insufficient documentation

## 2015-05-01 DIAGNOSIS — Y9389 Activity, other specified: Secondary | ICD-10-CM | POA: Diagnosis not present

## 2015-05-01 DIAGNOSIS — S299XXA Unspecified injury of thorax, initial encounter: Secondary | ICD-10-CM | POA: Diagnosis not present

## 2015-05-01 DIAGNOSIS — Z87891 Personal history of nicotine dependence: Secondary | ICD-10-CM | POA: Diagnosis not present

## 2015-05-01 DIAGNOSIS — R079 Chest pain, unspecified: Secondary | ICD-10-CM | POA: Diagnosis not present

## 2015-05-01 DIAGNOSIS — S3991XA Unspecified injury of abdomen, initial encounter: Secondary | ICD-10-CM | POA: Diagnosis present

## 2015-05-01 LAB — I-STAT CHEM 8, ED
BUN: 24 mg/dL — AB (ref 6–20)
CREATININE: 0.8 mg/dL (ref 0.44–1.00)
Calcium, Ion: 1.14 mmol/L (ref 1.12–1.23)
Chloride: 104 mmol/L (ref 101–111)
Glucose, Bld: 97 mg/dL (ref 65–99)
HEMATOCRIT: 47 % — AB (ref 36.0–46.0)
HEMOGLOBIN: 16 g/dL — AB (ref 12.0–15.0)
POTASSIUM: 3.1 mmol/L — AB (ref 3.5–5.1)
Sodium: 142 mmol/L (ref 135–145)
TCO2: 25 mmol/L (ref 0–100)

## 2015-05-01 MED ORDER — SODIUM CHLORIDE 0.9 % IV BOLUS (SEPSIS)
1000.0000 mL | Freq: Once | INTRAVENOUS | Status: AC
  Administered 2015-05-01: 1000 mL via INTRAVENOUS

## 2015-05-01 MED ORDER — IOHEXOL 300 MG/ML  SOLN
100.0000 mL | Freq: Once | INTRAMUSCULAR | Status: AC | PRN
Start: 2015-05-01 — End: 2015-05-01
  Administered 2015-05-01: 100 mL via INTRAVENOUS

## 2015-05-01 MED ORDER — FENTANYL CITRATE (PF) 100 MCG/2ML IJ SOLN
50.0000 ug | Freq: Once | INTRAMUSCULAR | Status: AC
  Administered 2015-05-01: 50 ug via INTRAVENOUS
  Filled 2015-05-01: qty 2
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
## 2015-05-06 ENCOUNTER — Ambulatory Visit: Payer: Commercial Managed Care - HMO | Admitting: Physical Therapy

## 2015-05-08 ENCOUNTER — Encounter (HOSPITAL_COMMUNITY): Payer: Self-pay

## 2015-05-08 ENCOUNTER — Ambulatory Visit: Payer: Commercial Managed Care - HMO

## 2015-05-08 ENCOUNTER — Emergency Department (HOSPITAL_COMMUNITY): Payer: Commercial Managed Care - HMO

## 2015-05-08 ENCOUNTER — Emergency Department (HOSPITAL_COMMUNITY)
Admission: EM | Admit: 2015-05-08 | Discharge: 2015-05-08 | Disposition: A | Discharge status: Home / Self Care | Payer: Commercial Managed Care - HMO | Attending: Emergency Medicine | Admitting: Emergency Medicine

## 2015-05-08 DIAGNOSIS — I10 Essential (primary) hypertension: Secondary | ICD-10-CM | POA: Insufficient documentation

## 2015-05-08 DIAGNOSIS — Z8639 Personal history of other endocrine, nutritional and metabolic disease: Secondary | ICD-10-CM | POA: Diagnosis not present

## 2015-05-08 DIAGNOSIS — Z87891 Personal history of nicotine dependence: Secondary | ICD-10-CM | POA: Diagnosis not present

## 2015-05-08 DIAGNOSIS — Z79899 Other long term (current) drug therapy: Secondary | ICD-10-CM | POA: Diagnosis not present

## 2015-05-08 DIAGNOSIS — R05 Cough: Secondary | ICD-10-CM | POA: Diagnosis not present

## 2015-05-08 DIAGNOSIS — J3489 Other specified disorders of nose and nasal sinuses: Secondary | ICD-10-CM | POA: Diagnosis not present

## 2015-05-08 DIAGNOSIS — R112 Nausea with vomiting, unspecified: Secondary | ICD-10-CM | POA: Diagnosis not present

## 2015-05-08 DIAGNOSIS — R1110 Vomiting, unspecified: Secondary | ICD-10-CM | POA: Diagnosis present

## 2015-05-08 DIAGNOSIS — R1111 Vomiting without nausea: Secondary | ICD-10-CM | POA: Diagnosis not present

## 2015-05-08 DIAGNOSIS — H6693 Otitis media, unspecified, bilateral: Secondary | ICD-10-CM | POA: Diagnosis not present

## 2015-05-08 LAB — COMPREHENSIVE METABOLIC PANEL
ALBUMIN: 3.8 g/dL (ref 3.5–5.0)
ALT: 40 U/L (ref 14–54)
AST: 24 U/L (ref 15–41)
Alkaline Phosphatase: 183 U/L — ABNORMAL HIGH (ref 38–126)
Anion gap: 12 (ref 5–15)
BILIRUBIN TOTAL: 0.9 mg/dL (ref 0.3–1.2)
BUN: 14 mg/dL (ref 6–20)
CHLORIDE: 105 mmol/L (ref 101–111)
CO2: 23 mmol/L (ref 22–32)
Calcium: 9.5 mg/dL (ref 8.9–10.3)
Creatinine, Ser: 0.83 mg/dL (ref 0.44–1.00)
GFR calc Af Amer: >60 mL/min (ref >60)
GFR calc non Af Amer: >60 mL/min (ref >60)
GLUCOSE: 139 mg/dL — AB (ref 65–99)
POTASSIUM: 2.9 mmol/L — AB (ref 3.5–5.1)
Sodium: 140 mmol/L (ref 135–145)
Total Protein: 7.8 g/dL (ref 6.5–8.1)

## 2015-05-08 LAB — URINALYSIS, ROUTINE W REFLEX MICROSCOPIC
GLUCOSE, UA: NEGATIVE mg/dL
HGB URINE DIPSTICK: NEGATIVE
KETONES UR: 40 mg/dL — AB
Nitrite: NEGATIVE
PH: 7 (ref 5.0–8.0)
Protein, ur: 30 mg/dL — AB
Specific Gravity, Urine: 1.031 — ABNORMAL HIGH (ref 1.005–1.030)

## 2015-05-08 LAB — CBC
HEMATOCRIT: 42.6 % (ref 36.0–46.0)
Hemoglobin: 15 g/dL (ref 12.0–15.0)
MCH: 30.2 pg (ref 26.0–34.0)
MCHC: 35.2 g/dL (ref 30.0–36.0)
MCV: 85.7 fL (ref 78.0–100.0)
Platelets: 252 10*3/uL (ref 150–400)
RBC: 4.97 MIL/uL (ref 3.87–5.11)
RDW: 12.5 % (ref 11.5–15.5)
WBC: 12.9 10*3/uL — ABNORMAL HIGH (ref 4.0–10.5)

## 2015-05-08 LAB — LIPASE, BLOOD: LIPASE: 42 U/L (ref 11–51)

## 2015-05-08 LAB — URINE MICROSCOPIC-ADD ON

## 2015-05-08 MED ORDER — POTASSIUM CHLORIDE ER 20 MEQ PO TBCR: 20.0000 meq | EXTENDED_RELEASE_TABLET | Freq: Every day | ORAL | Status: DC

## 2015-05-08 MED ORDER — AMOXICILLIN-POT CLAVULANATE 875-125 MG PO TABS: 1.0000 | ORAL_TABLET | Freq: Two times a day (BID) | ORAL | Status: DC

## 2015-05-08 MED ORDER — SODIUM CHLORIDE 0.9 % IV BOLUS (SEPSIS): 1000.0000 mL | Freq: Once | INTRAVENOUS | Status: DC

## 2015-05-08 MED ORDER — POTASSIUM CHLORIDE 10 MEQ/100ML IV SOLN
10.0000 meq | Freq: Once | INTRAVENOUS | Status: AC
  Administered 2015-05-08: 10 meq via INTRAVENOUS
  Filled 2015-05-08: qty 100

## 2015-05-08 MED ORDER — ONDANSETRON HCL 4 MG PO TABS: 4.0000 mg | ORAL_TABLET | Freq: Four times a day (QID) | ORAL | Status: DC

## 2015-05-08 MED ORDER — SODIUM CHLORIDE 0.9 % IV BOLUS (SEPSIS)
1000.0000 mL | Freq: Once | INTRAVENOUS | Status: AC
  Administered 2015-05-08: 1000 mL via INTRAVENOUS

## 2015-05-08 MED ORDER — SODIUM CHLORIDE 0.9 % IV BOLUS (SEPSIS)
500.0000 mL | Freq: Once | INTRAVENOUS | Status: AC
  Administered 2015-05-08: 500 mL via INTRAVENOUS

## 2015-05-08 MED ORDER — ONDANSETRON HCL 4 MG/2ML IJ SOLN
4.0000 mg | Freq: Once | INTRAMUSCULAR | Status: AC | PRN
  Administered 2015-05-08: 4 mg via INTRAVENOUS
  Filled 2015-05-08: qty 2

## 2015-05-08 NOTE — ED Notes (Signed)
Pt ambulated to the bathroom with ease 
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
## 2015-05-13 ENCOUNTER — Ambulatory Visit
Admission: RE | Admit: 2015-05-13 | Discharge: 2015-05-13 | Disposition: A | Discharge status: Home / Self Care | Payer: Commercial Managed Care - HMO | Source: Ambulatory Visit | Attending: Family | Admitting: Family

## 2015-05-13 DIAGNOSIS — Z1239 Encounter for other screening for malignant neoplasm of breast: Secondary | ICD-10-CM

## 2015-05-13 DIAGNOSIS — Z1231 Encounter for screening mammogram for malignant neoplasm of breast: Secondary | ICD-10-CM | POA: Diagnosis not present

## 2015-05-14 ENCOUNTER — Other Ambulatory Visit: Payer: Self-pay | Admitting: Family

## 2015-05-14 DIAGNOSIS — R928 Other abnormal and inconclusive findings on diagnostic imaging of breast: Secondary | ICD-10-CM

## 2015-05-15 ENCOUNTER — Encounter (HOSPITAL_COMMUNITY): Payer: Self-pay | Admitting: Emergency Medicine

## 2015-05-15 ENCOUNTER — Emergency Department (HOSPITAL_COMMUNITY)
Admission: EM | Admit: 2015-05-15 | Discharge: 2015-05-15 | Disposition: A | Discharge status: Home / Self Care | Payer: Commercial Managed Care - HMO | Attending: Physician Assistant | Admitting: Physician Assistant

## 2015-05-15 DIAGNOSIS — I10 Essential (primary) hypertension: Secondary | ICD-10-CM | POA: Insufficient documentation

## 2015-05-15 DIAGNOSIS — Z8709 Personal history of other diseases of the respiratory system: Secondary | ICD-10-CM | POA: Insufficient documentation

## 2015-05-15 DIAGNOSIS — H6693 Otitis media, unspecified, bilateral: Secondary | ICD-10-CM | POA: Insufficient documentation

## 2015-05-15 DIAGNOSIS — Z8639 Personal history of other endocrine, nutritional and metabolic disease: Secondary | ICD-10-CM | POA: Diagnosis not present

## 2015-05-15 DIAGNOSIS — Z79899 Other long term (current) drug therapy: Secondary | ICD-10-CM | POA: Diagnosis not present

## 2015-05-15 DIAGNOSIS — Z87891 Personal history of nicotine dependence: Secondary | ICD-10-CM | POA: Diagnosis not present

## 2015-05-15 DIAGNOSIS — H9203 Otalgia, bilateral: Secondary | ICD-10-CM | POA: Diagnosis present

## 2015-05-15 DIAGNOSIS — H65196 Other acute nonsuppurative otitis media, recurrent, bilateral: Secondary | ICD-10-CM

## 2015-05-15 MED ORDER — CEFDINIR 300 MG PO CAPS: 300.0000 mg | ORAL_CAPSULE | Freq: Two times a day (BID) | ORAL | Status: DC

## 2015-05-15 NOTE — ED Notes (Signed)
Has just finished course of anbiotics for ear infections without improvement.

## 2015-05-15 NOTE — Discharge Instructions (Signed)

## 2015-05-15 NOTE — ED Provider Notes (Signed)
CSN: [id number]     Arrival date & time 05/15/15  1429 History  By signing my name below, I, Essence Howell, attest that this documentation has been prepared under the direction and in the presence of Montine Circle, PA-C Electronically Signed: Ladene Artist, ED Scribe 05/15/2015 at 3:08 PM.   Chief Complaint  Patient presents with  . Otalgia   The history is provided by the patient. No language interpreter was used.   HPI Comments: Amy Vincent is a 60 y.o. female who presents to the Emergency Department with a chief complaint of constant, gradually worsening bilateral ear pain since last week. Pt was seen in the ED on 05/08/15 for URI and was discharged with Augmentin which she finished this morning. She states that she is still experiencing fullness in both ears and worsening bilateral ear pain. No other alleviating or aggravating factors.  Past Medical History  Diagnosis Date  . Hypertension   . High cholesterol    Past Surgical History  Procedure Laterality Date  . Incision and drainage      I & D of an abscess on back  . Abdominal hysterectomy  1999  . Left arm surgery  2004    orthopedic surgery left arm due to fracture as a child that healed improperly   Family History  Problem Relation Age of Onset  . Cancer Mother 27    breast  . Diabetes Father   . Cancer Maternal Aunt 68    breast  . Hypertension Paternal Uncle   . Cancer Maternal Aunt     stomach   Social History  Substance Use Topics  . Smoking status: Former Smoker    Quit date: 05/09/1994  . Smokeless tobacco: Never Used  . Alcohol Use: 1.8 oz/week    3 Standard drinks or equivalent per week   OB History    No data available     Review of Systems  Constitutional: Negative for fever.  HENT: Positive for ear pain.    Allergies  Review of patient's allergies indicates no known allergies.  Home Medications   Prior to Admission medications   Medication Sig Start Date End Date Taking? Authorizing  Provider  amoxicillin-clavulanate (AUGMENTIN) 875-125 MG tablet Take 1 tablet by mouth every 12 (twelve) hours. 05/08/15   Okey Regal, PA-C  diltiazem (DILACOR XR) 240 MG 24 hr capsule Take 1 capsule by mouth daily. 12/26/14   Historical Provider, MD  HYDROcodone-acetaminophen (NORCO/VICODIN) 5-325 MG tablet Take 1 tablet by mouth 3 (three) times daily as needed for moderate pain.  04/14/15   Historical Provider, MD  losartan-hydrochlorothiazide (HYZAAR) 100-25 MG per tablet Take 1 tablet by mouth daily.    Historical Provider, MD  metoprolol succinate (TOPROL-XL) 25 MG 24 hr tablet Take 1 tablet (25 mg total) by mouth daily. 03/25/15   Debbrah Alar, NP  ondansetron (ZOFRAN) 4 MG tablet Take 1 tablet (4 mg total) by mouth every 6 (six) hours. 05/08/15   Okey Regal, PA-C  potassium chloride 20 MEQ TBCR Take 20 mEq by mouth daily. 05/08/15   Jeffrey Hedges, PA-C   BP 151/85 mmHg  Pulse 77  Temp(Src) 98.2 F (36.8 C) (Oral)  Resp 20  SpO2 100% Physical Exam  Constitutional: She is oriented to person, place, and time. She appears well-developed and well-nourished. No distress.  HENT:  Head: Normocephalic and atraumatic.  Right Ear: Tympanic membrane is erythematous.  Left Ear: Tympanic membrane is erythematous.  Bilateral TMs ar erythematous with congestion.  Eyes: Conjunctivae and EOM are normal.  Neck: Neck supple. No tracheal deviation present.  Cardiovascular: Normal rate.   Pulmonary/Chest: Effort normal. No respiratory distress.  Musculoskeletal: Normal range of motion.  Neurological: She is alert and oriented to person, place, and time.  Skin: Skin is warm and dry.  Psychiatric: She has a normal mood and affect. Her behavior is normal.  Nursing note and vitals reviewed.  ED Course  Procedures (including critical care time) DIAGNOSTIC STUDIES: Oxygen Saturation is 100% on RA, normal by my interpretation.    COORDINATION OF CARE: 3:03 PM-Discussed treatment plan  which includes Omnicef with pt at bedside and pt agreed to plan.    MDM   Final diagnoses:  Recurrent subacute otitis media of both ears, unspecified otitis media type    Patient with persistent otitis media. Bilateral tympanic membranes are erythematous with congestion. She was diligent about taking her Augmentin, but has not had any relief. I would've suspected Augmentin to treat this appropriately, however she felt treatment with Augmentin, will switch her to [facility]. Recommend primary care follow-up in 3 days. Patient understands and agrees the plan. She is stable and ready for discharge.  I personally performed the services described in this documentation, which was scribed in my presence. The recorded information has been reviewed and is accurate.     Montine Circle, PA-C 05/15/15 1521  Courteney Lyn Mackuen, MD 05/16/15 0800

## 2015-05-19 ENCOUNTER — Ambulatory Visit
Admission: RE | Admit: 2015-05-19 | Discharge: 2015-05-19 | Disposition: A | Discharge status: Home / Self Care | Payer: Commercial Managed Care - HMO | Source: Ambulatory Visit | Attending: Family | Admitting: Family

## 2015-05-19 DIAGNOSIS — N63 Unspecified lump in breast: Secondary | ICD-10-CM | POA: Diagnosis not present

## 2015-05-19 DIAGNOSIS — R928 Other abnormal and inconclusive findings on diagnostic imaging of breast: Secondary | ICD-10-CM

## 2015-05-19 DIAGNOSIS — N6489 Other specified disorders of breast: Secondary | ICD-10-CM | POA: Diagnosis not present

## 2015-05-22 ENCOUNTER — Other Ambulatory Visit: Payer: Self-pay | Admitting: Family

## 2015-05-22 ENCOUNTER — Encounter: Payer: Self-pay | Admitting: Family

## 2015-05-22 ENCOUNTER — Telehealth: Payer: Self-pay | Admitting: *Deleted

## 2015-05-22 ENCOUNTER — Ambulatory Visit (INDEPENDENT_AMBULATORY_CARE_PROVIDER_SITE_OTHER): Payer: Commercial Managed Care - HMO | Admitting: Family

## 2015-05-22 VITALS — BP 146/84 | HR 70 | Temp 98.7°F | Resp 16 | Ht 64.0 in | Wt 179.2 lb

## 2015-05-22 DIAGNOSIS — F329 Major depressive disorder, single episode, unspecified: Secondary | ICD-10-CM | POA: Diagnosis not present

## 2015-05-22 DIAGNOSIS — E876 Hypokalemia: Secondary | ICD-10-CM | POA: Diagnosis not present

## 2015-05-22 DIAGNOSIS — H6593 Unspecified nonsuppurative otitis media, bilateral: Secondary | ICD-10-CM | POA: Diagnosis not present

## 2015-05-22 DIAGNOSIS — F32A Depression, unspecified: Secondary | ICD-10-CM

## 2015-05-22 DIAGNOSIS — I10 Essential (primary) hypertension: Secondary | ICD-10-CM | POA: Diagnosis not present

## 2015-05-22 LAB — BASIC METABOLIC PANEL
BUN: 13 mg/dL (ref 6–23)
CALCIUM: 9.7 mg/dL (ref 8.4–10.5)
CHLORIDE: 107 meq/L (ref 96–112)
CO2: 26 mEq/L (ref 19–32)
CREATININE: 0.66 mg/dL (ref 0.40–1.20)
GFR: 117.47 mL/min (ref >60.00)
Glucose, Bld: 94 mg/dL (ref 70–99)
Potassium: 3.6 mEq/L (ref 3.5–5.1)
Sodium: 141 mEq/L (ref 135–145)

## 2015-05-22 MED ORDER — POTASSIUM CHLORIDE ER 20 MEQ PO TBCR: 20.0000 meq | EXTENDED_RELEASE_TABLET | Freq: Every day | ORAL | Status: DC

## 2015-05-22 MED ORDER — VENLAFAXINE HCL ER 37.5 MG PO CP24: ORAL_CAPSULE | ORAL | Status: DC

## 2015-05-22 NOTE — Progress Notes (Signed)
Pre visit review using our clinic review tool, if applicable. No additional management support is needed unless otherwise documented below in the visit note. 

## 2015-05-22 NOTE — Telephone Encounter (Signed)
Received fax from [facility] that Venlafaxine ER will need prior auth. Spoke with  CSR, J.M.  He states he will upload form to covermymeds and fax form to office as well.

## 2015-05-22 NOTE — Telephone Encounter (Signed)
Rx for promethazine printed at today's office visit and was faxed to [facility].

## 2015-05-22 NOTE — Patient Instructions (Addendum)
Start claritin 10mg  once daily. Continue cefdinir for your ear infections.  You will be contacted about scheduling your appointment with ENT specialist for your ears. Start Effexor 37.5mg   1 tabs by mouth once daily for 3 days, then increase to 2 tabs by mouth once daily. Go to the ER  or call 911 if you develop thoughts of hurting yourself or others.

## 2015-05-22 NOTE — Assessment & Plan Note (Signed)
Uncontrolled. We did contact [facility] and confirmed that they have rx on file. Advised pt to begin effexor.,  She is not actively suicidal. We did discuss that if she does develop SI she is to go directly to the ED and she verbalizes understanding.
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
## 2015-05-22 NOTE — Telephone Encounter (Signed)
Never received form via covermymeds or fax. Called and spoke with [place] at [facility]. Answered questions via phone and advised her this is an urgent review as pt needs to start medication today. She advised that request has been submitted as urgent but process may still take up to 24 hours to determine. PA is for quantity limit of 60 tabs per 30 days.  I spoke with PCP and she states to send Rx for 30 tablets. Verified with [place] that insurance allow Rx for 30 tablets. Ins. Will still proceed with PA for 60 tabs/30days.  Spoke with pharmacist and gave verbal auth to run Rx for 30 tabs and he verified that Rx went through. Notified pt that she can check with pharmacy this evening and may be able to start medication today. Pt voices understanding.

## 2015-05-25 NOTE — Telephone Encounter (Signed)
Received fax Approval for Venlafaxine HCL 37.5mg  Cap 60/30; fax to pharmacy/SLS

## 2015-05-29 MED ORDER — LOSARTAN POTASSIUM-HCTZ 100-25 MG PO TABS: 1.0000 | ORAL_TABLET | Freq: Every day | ORAL | Status: DC

## 2015-05-29 MED ORDER — DILTIAZEM HCL ER 240 MG PO CP24: 240.0000 mg | ORAL_CAPSULE | Freq: Every day | ORAL | Status: DC

## 2015-06-03 NOTE — Telephone Encounter (Signed)
Notified pt and she voices understanding. States she is feeling better on the Effexor.

## 2015-06-03 NOTE — Telephone Encounter (Addendum)
See mychart message. She should continue claritin, can add flonase 2 sprays each nostril once daily. How is she feeling on the Effexor? Is her mood improving?

## 2015-06-09 DIAGNOSIS — H6982 Other specified disorders of Eustachian tube, left ear: Secondary | ICD-10-CM | POA: Diagnosis not present

## 2015-06-09 DIAGNOSIS — H903 Sensorineural hearing loss, bilateral: Secondary | ICD-10-CM | POA: Diagnosis not present

## 2015-06-19 ENCOUNTER — Ambulatory Visit: Payer: Medicaid Other | Admitting: Family

## 2015-08-11 ENCOUNTER — Ambulatory Visit: Payer: Commercial Managed Care - HMO | Admitting: Family

## 2015-08-12 ENCOUNTER — Ambulatory Visit (INDEPENDENT_AMBULATORY_CARE_PROVIDER_SITE_OTHER): Payer: Commercial Managed Care - HMO | Admitting: Family

## 2015-08-12 ENCOUNTER — Encounter: Payer: Self-pay | Admitting: Family

## 2015-08-12 VITALS — BP 148/88 | HR 67 | Temp 98.3°F | Resp 18 | Ht 64.0 in | Wt 185.8 lb

## 2015-08-12 DIAGNOSIS — F32A Depression, unspecified: Secondary | ICD-10-CM

## 2015-08-12 DIAGNOSIS — M545 Low back pain: Secondary | ICD-10-CM | POA: Diagnosis not present

## 2015-08-12 DIAGNOSIS — B351 Tinea unguium: Secondary | ICD-10-CM

## 2015-08-12 DIAGNOSIS — G8929 Other chronic pain: Secondary | ICD-10-CM

## 2015-08-12 DIAGNOSIS — F329 Major depressive disorder, single episode, unspecified: Secondary | ICD-10-CM | POA: Diagnosis not present

## 2015-08-12 MED ORDER — TERBINAFINE HCL 250 MG PO TABS: 250.0000 mg | ORAL_TABLET | Freq: Every day | ORAL | Status: DC

## 2015-08-12 MED ORDER — MELOXICAM 7.5 MG PO TABS: 7.5000 mg | ORAL_TABLET | Freq: Every day | ORAL | Status: DC | PRN

## 2015-08-12 NOTE — Progress Notes (Signed)
Pre visit review using our clinic review tool, if applicable. No additional management support is needed unless otherwise documented below in the visit note. 

## 2015-08-12 NOTE — Assessment & Plan Note (Signed)
rx with lamisil. Plan repeat LFT in 1 month when she returns for cpx.

## 2015-08-12 NOTE — Assessment & Plan Note (Signed)
Deteriorated. Add meloxicam prn, refer back to PT.

## 2015-08-12 NOTE — Patient Instructions (Signed)
Start meloxicam once daily as needed for low back pain. Start lamisil once daily for toenail fungus. You will be contacted about your referral to physical therapy.

## 2015-08-12 NOTE — Progress Notes (Signed)
Subjective:    Patient ID: Amy Vincent, female    DOB: 04-Feb-1956, 60 y.o.   MRN: [id number]  HPI   Amy Vincent is a 60 yr old female who presents today with chief complaint of back pain.  She has hx of low back pain since 2009.   Pain is located on the right side of her back and has been present x 1 months.  Pain is worsened by sitting/laying.  Pain is described as aching in nature.  In the past she has done PT which helped. Walking seems to help. Denies bowel/bladder incontinence. Denies dysuria.    Depression- stopped effexor due to nausea/vomiting. Reports that her mood has been very good recently. She reports that she "came into some money" and as a result, a lot of her stressors have resolved. She reports that she bought a new car and a new home. Denies depression symptoms.   Toenail problem- reports thickening of left great toenail  Review of Systems See HPI  Past Medical History  Diagnosis Date  . Hypertension   . High cholesterol     Social History   Social History  . Marital Status: Single    Spouse Name: N/A  . Number of Children: N/A  . Years of Education: N/A   Occupational History  . Not on file.   Social History Main Topics  . Smoking status: Former Smoker    Quit date: 05/09/1994  . Smokeless tobacco: Never Used  . Alcohol Use: 1.8 oz/week    3 Standard drinks or equivalent per week  . Drug Use: No  . Sexual Activity: Not on file   Other Topics Concern  . Not on file   Social History Narrative   No children   On disability   Originally from [place]   Single, sister and brother liv   Lives with girlfriend    Enjoys gardening, antique "flips"    Past Surgical History  Procedure Laterality Date  . Incision and drainage      I & D of an abscess on back  . Abdominal hysterectomy  1999  . Left arm surgery  2004    orthopedic surgery left arm due to fracture as a child that healed improperly    Family History  Problem Relation Age of Onset  .  Cancer Mother 19    breast  . Diabetes Father   . Cancer Maternal Aunt 61    breast  . Hypertension Paternal Uncle   . Cancer Maternal Aunt     stomach    Allergies  Allergen Reactions  . Venlafaxine Nausea And Vomiting    Current Outpatient Prescriptions on File Prior to Visit  Medication Sig Dispense Refill  . diltiazem (DILACOR XR) 240 MG 24 hr capsule Take 1 capsule (240 mg total) by mouth daily. 30 capsule 5  . losartan-hydrochlorothiazide (HYZAAR) 100-25 MG tablet Take 1 tablet by mouth daily. 30 tablet 5  . metoprolol succinate (TOPROL-XL) 25 MG 24 hr tablet Take 1 tablet (25 mg total) by mouth daily. 30 tablet 3   No current facility-administered medications on file prior to visit.    BP 148/88 mmHg  Pulse 67  Temp(Src) 98.3 F (36.8 C) (Oral)  Resp 18  Ht 5\' 4"  (1.626 m)  Wt 185 lb 12.8 oz (84.278 kg)  BMI 31.88 kg/m2  SpO2 100%       Objective:   Physical Exam  Constitutional: She is oriented to person, place, and time. She  appears well-developed and well-nourished.  Cardiovascular: Normal rate, regular rhythm and normal heart sounds.   No murmur heard. Pulmonary/Chest: Effort normal and breath sounds normal. No respiratory distress. She has no wheezes.  Musculoskeletal:       Cervical back: She exhibits no tenderness.       Thoracic back: She exhibits no tenderness.       Lumbar back: She exhibits no tenderness.  Appears to have some mild scoliosis of the lumbar spine  Neurological: She is alert and oriented to person, place, and time.  Skin:  Left great toenail appears thickened and irregular/discolored.   Psychiatric: She has a normal mood and affect. Her behavior is normal. Judgment and thought content normal.          Assessment & Plan:

## 2015-08-12 NOTE — Assessment & Plan Note (Signed)
Stable off of meds.  Monitor.  

## 2015-08-18 ENCOUNTER — Encounter: Payer: Self-pay | Admitting: Family

## 2015-09-03 ENCOUNTER — Other Ambulatory Visit: Payer: Self-pay | Admitting: Family

## 2015-09-14 ENCOUNTER — Ambulatory Visit (INDEPENDENT_AMBULATORY_CARE_PROVIDER_SITE_OTHER): Payer: Commercial Managed Care - HMO | Admitting: Family

## 2015-09-14 ENCOUNTER — Encounter: Payer: Self-pay | Admitting: Family

## 2015-09-14 VITALS — BP 152/102 | HR 62 | Temp 98.3°F | Resp 18 | Ht 64.0 in | Wt 183.6 lb

## 2015-09-14 DIAGNOSIS — I10 Essential (primary) hypertension: Secondary | ICD-10-CM

## 2015-09-14 DIAGNOSIS — Z5181 Encounter for therapeutic drug level monitoring: Secondary | ICD-10-CM

## 2015-09-14 DIAGNOSIS — M5416 Radiculopathy, lumbar region: Secondary | ICD-10-CM

## 2015-09-14 DIAGNOSIS — M5417 Radiculopathy, lumbosacral region: Secondary | ICD-10-CM | POA: Diagnosis not present

## 2015-09-14 DIAGNOSIS — M545 Low back pain, unspecified: Secondary | ICD-10-CM

## 2015-09-14 DIAGNOSIS — B351 Tinea unguium: Secondary | ICD-10-CM

## 2015-09-14 DIAGNOSIS — E785 Hyperlipidemia, unspecified: Secondary | ICD-10-CM | POA: Diagnosis not present

## 2015-09-14 DIAGNOSIS — G8929 Other chronic pain: Secondary | ICD-10-CM

## 2015-09-14 LAB — HEPATIC FUNCTION PANEL
ALK PHOS: 117 U/L (ref 39–117)
ALT: 27 U/L (ref 0–35)
AST: 17 U/L (ref 0–37)
Albumin: 4.1 g/dL (ref 3.5–5.2)
BILIRUBIN TOTAL: 0.6 mg/dL (ref 0.2–1.2)
Bilirubin, Direct: 0.1 mg/dL (ref 0.0–0.3)
Total Protein: 7.2 g/dL (ref 6.0–8.3)

## 2015-09-14 MED ORDER — LOSARTAN POTASSIUM-HCTZ 100-25 MG PO TABS: 1.0000 | ORAL_TABLET | Freq: Every day | ORAL | Status: DC

## 2015-09-14 MED ORDER — HYDROCODONE-ACETAMINOPHEN 5-325 MG PO TABS
1.0000 | ORAL_TABLET | Freq: Four times a day (QID) | ORAL | Status: DC | PRN
Start: 2015-09-14 — End: 2015-11-05

## 2015-09-14 MED ORDER — TERBINAFINE HCL 250 MG PO TABS: 250.0000 mg | ORAL_TABLET | Freq: Every day | ORAL | Status: DC

## 2015-09-14 MED ORDER — DILTIAZEM HCL ER 240 MG PO CP24: 240.0000 mg | ORAL_CAPSULE | Freq: Every day | ORAL | Status: DC

## 2015-09-14 MED ORDER — MELOXICAM 7.5 MG PO TABS: 7.5000 mg | ORAL_TABLET | Freq: Every day | ORAL | Status: DC

## 2015-09-14 MED ORDER — METOPROLOL SUCCINATE ER 25 MG PO TB24: 25.0000 mg | ORAL_TABLET | Freq: Every day | ORAL | Status: DC

## 2015-09-14 NOTE — Assessment & Plan Note (Signed)
BP up today, likely due to pain.  Will recheck in 1 month.

## 2015-09-14 NOTE — Progress Notes (Signed)
Subjective:    Patient ID: Amy Vincent, female    DOB: Sep 26, 1955, 60 y.o.   MRN: [id number]  HPI  Amy Vincent is a 60 yr old female who presents today with chief complaint of right sided low back pain.  Pain has been present x >1 month, however over the last 1 week has worsened.  Pain continues to radiate down the right leg.  Tried meloxicam without improvement in her symptoms.   Onychomycosis-  Completed 1 month of lamisil.  So far, reports no improvement in her toenail.  Review of Systems    see  HPI  Past Medical History  Diagnosis Date  . Hypertension   . High cholesterol      Social History   Social History  . Marital Status: Single    Spouse Name: N/A  . Number of Children: N/A  . Years of Education: N/A   Occupational History  . Not on file.   Social History Main Topics  . Smoking status: Former Smoker    Quit date: 05/09/1994  . Smokeless tobacco: Never Used  . Alcohol Use: 1.8 oz/week    3 Standard drinks or equivalent per week  . Drug Use: No  . Sexual Activity: Not on file   Other Topics Concern  . Not on file   Social History Narrative   No children   On disability   Originally from [place]   Single, sister and brother liv   Lives with girlfriend    Enjoys gardening, antique "flips"    Past Surgical History  Procedure Laterality Date  . Incision and drainage      I & D of an abscess on back  . Abdominal hysterectomy  1999  . Left arm surgery  2004    orthopedic surgery left arm due to fracture as a child that healed improperly    Family History  Problem Relation Age of Onset  . Cancer Mother 65    breast  . Diabetes Father   . Cancer Maternal Aunt 10    breast  . Hypertension Paternal Uncle   . Cancer Maternal Aunt     stomach    Allergies  Allergen Reactions  . Venlafaxine Nausea And Vomiting    Current Outpatient Prescriptions on File Prior to Visit  Medication Sig Dispense Refill  . diltiazem (DILACOR XR) 240 MG 24 hr  capsule Take 1 capsule (240 mg total) by mouth daily. 30 capsule 5  . losartan-hydrochlorothiazide (HYZAAR) 100-25 MG tablet Take 1 tablet by mouth daily. 30 tablet 5  . metoprolol succinate (TOPROL-XL) 25 MG 24 hr tablet TAKE ONE TABLET BY MOUTH ONCE DAILY 30 tablet 0   No current facility-administered medications on file prior to visit.    BP 152/102 mmHg  Pulse 62  Temp(Src) 98.3 F (36.8 C) (Oral)  Resp 18  Ht 5\' 4"  (1.626 m)  Wt 183 lb 9.6 oz (83.28 kg)  BMI 31.50 kg/m2  SpO2 98%    Objective:   Physical Exam  Constitutional: She appears well-developed and well-nourished.  Cardiovascular: Normal rate, regular rhythm and normal heart sounds.   No murmur heard. Pulmonary/Chest: Effort normal and breath sounds normal. No respiratory distress. She has no wheezes.  Musculoskeletal:       Lumbar back: She exhibits tenderness.  Neurological:  Bilateral straight leg raise R>L Bilateral LE strength is 5/5  Psychiatric: She has a normal mood and affect. Her behavior is normal. Judgment and thought content normal.  Skin:  R great toenail remains discolored and thickened.        Assessment & Plan:

## 2015-09-14 NOTE — Patient Instructions (Addendum)
Please schedule an appointment with PT.  For back pain, you may continue meloxicam once daily and hydrocodone 1 tab every 6 hours (short term only). You will be contacted about your referral for MRI.  Let me know if you have not heard back in 1 week about this appointment.

## 2015-09-14 NOTE — Assessment & Plan Note (Signed)
Deteriorated. Due to duration pain, advised pt to proceed with PT an MRI.  Will refill meloxicam, and give a 1 time rx for hydrocodone.

## 2015-09-14 NOTE — Progress Notes (Signed)
Pre visit review using our clinic review tool, if applicable. No additional management support is needed unless otherwise documented below in the visit note. 

## 2015-09-14 NOTE — Assessment & Plan Note (Signed)
Obtain lft, continue lamisil x 8 more weeks.

## 2015-09-19 ENCOUNTER — Ambulatory Visit (HOSPITAL_BASED_OUTPATIENT_CLINIC_OR_DEPARTMENT_OTHER)
Admission: RE | Admit: 2015-09-19 | Discharge: 2015-09-19 | Disposition: A | Discharge status: Home / Self Care | Payer: Commercial Managed Care - HMO | Source: Ambulatory Visit | Attending: Family | Admitting: Family

## 2015-09-19 DIAGNOSIS — M4807 Spinal stenosis, lumbosacral region: Secondary | ICD-10-CM | POA: Insufficient documentation

## 2015-09-19 DIAGNOSIS — M47896 Other spondylosis, lumbar region: Secondary | ICD-10-CM | POA: Diagnosis not present

## 2015-09-19 DIAGNOSIS — M5126 Other intervertebral disc displacement, lumbar region: Secondary | ICD-10-CM | POA: Diagnosis not present

## 2015-09-19 DIAGNOSIS — M47897 Other spondylosis, lumbosacral region: Secondary | ICD-10-CM | POA: Diagnosis not present

## 2015-09-19 DIAGNOSIS — M5417 Radiculopathy, lumbosacral region: Secondary | ICD-10-CM | POA: Insufficient documentation

## 2015-09-19 DIAGNOSIS — M5416 Radiculopathy, lumbar region: Secondary | ICD-10-CM

## 2015-09-19 DIAGNOSIS — M545 Low back pain: Secondary | ICD-10-CM | POA: Diagnosis present

## 2015-09-23 ENCOUNTER — Telehealth: Payer: Self-pay | Admitting: Family

## 2015-09-23 DIAGNOSIS — M545 Low back pain: Secondary | ICD-10-CM

## 2015-09-23 NOTE — Telephone Encounter (Signed)
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 I will refer her to neurosurgery for further evaluation.

## 2015-09-25 NOTE — Telephone Encounter (Signed)
Notified pt and she is agreeable to referral. 

## 2015-10-12 ENCOUNTER — Ambulatory Visit (INDEPENDENT_AMBULATORY_CARE_PROVIDER_SITE_OTHER): Payer: Commercial Managed Care - HMO | Admitting: Family

## 2015-10-12 ENCOUNTER — Telehealth: Payer: Self-pay | Admitting: *Deleted

## 2015-10-12 VITALS — BP 153/84 | HR 61 | Resp 16 | Ht 64.0 in | Wt 185.2 lb

## 2015-10-12 DIAGNOSIS — M545 Low back pain: Secondary | ICD-10-CM

## 2015-10-12 DIAGNOSIS — I10 Essential (primary) hypertension: Secondary | ICD-10-CM

## 2015-10-12 DIAGNOSIS — G8929 Other chronic pain: Secondary | ICD-10-CM

## 2015-10-12 DIAGNOSIS — B351 Tinea unguium: Secondary | ICD-10-CM

## 2015-10-12 LAB — HEPATIC FUNCTION PANEL
ALBUMIN: 4.1 g/dL (ref 3.5–5.2)
ALT: 28 U/L (ref 0–35)
AST: 24 U/L (ref 0–37)
Alkaline Phosphatase: 141 U/L — ABNORMAL HIGH (ref 39–117)
BILIRUBIN DIRECT: 0.1 mg/dL (ref 0.0–0.3)
Total Bilirubin: 0.5 mg/dL (ref 0.2–1.2)
Total Protein: 7.3 g/dL (ref 6.0–8.3)

## 2015-10-12 MED ORDER — TERBINAFINE HCL 250 MG PO TABS: 250.0000 mg | ORAL_TABLET | Freq: Every day | ORAL | Status: DC

## 2015-10-12 MED ORDER — HYDRALAZINE HCL 25 MG PO TABS: 25.0000 mg | ORAL_TABLET | Freq: Three times a day (TID) | ORAL | Status: DC

## 2015-10-12 NOTE — Progress Notes (Signed)
Pre visit review using our clinic review tool, if applicable. No additional management support is needed unless otherwise documented below in the visit note. 

## 2015-10-12 NOTE — Telephone Encounter (Signed)
Records and insurance auth have been re-faxed

## 2015-10-12 NOTE — Patient Instructions (Addendum)
Complete lab work prior to leaving.  Please add hydralazine 25mg  3 x daily for blood pressure. Follow up in 1 month.

## 2015-10-12 NOTE — Telephone Encounter (Signed)
Delsa Sale-- I spoke with [facility] and they said they never received our referral on pt in May. Can you re-fax them the referral, note and MRI? Pt has left message for them to call her back to schedule consultation. Thanks!

## 2015-10-12 NOTE — Progress Notes (Signed)
Subjective:    Patient ID: Amy Vincent, female    DOB: Feb 08, 1956, 60 y.o.   MRN: [id number]  HPI  Amy Vincent is a 60 yr old female who presents today for 1 month follow up.  1) HTN- reports good compliance with her BP meds.   BP Readings from Last 3 Encounters:  10/12/15 153/84  09/14/15 152/102  08/12/15 148/88   2) onychomycosis- maintained on lamisil.  Notes no improvement in her toenail thickening yet.   3) Back pain- referrals made for Neurosurgery and PT- pt reports she was not contacted about these appointments.  Back pain is unchanged.  Review of Systems See HPI  Past Medical History  Diagnosis Date  . Hypertension   . High cholesterol      Social History   Social History  . Marital Status: Single    Spouse Name: N/A  . Number of Children: N/A  . Years of Education: N/A   Occupational History  . Not on file.   Social History Main Topics  . Smoking status: Former Smoker    Quit date: 05/09/1994  . Smokeless tobacco: Never Used  . Alcohol Use: 1.8 oz/week    3 Standard drinks or equivalent per week  . Drug Use: No  . Sexual Activity: Not on file   Other Topics Concern  . Not on file   Social History Narrative   No children   On disability   Originally from [place]   Single, sister and brother liv   Lives with girlfriend    Enjoys gardening, antique "flips"    Past Surgical History  Procedure Laterality Date  . Incision and drainage      I & D of an abscess on back  . Abdominal hysterectomy  1999  . Left arm surgery  2004    orthopedic surgery left arm due to fracture as a child that healed improperly    Family History  Problem Relation Age of Onset  . Cancer Mother 36    breast  . Diabetes Father   . Cancer Maternal Aunt 73    breast  . Hypertension Paternal Uncle   . Cancer Maternal Aunt     stomach    Allergies  Allergen Reactions  . Venlafaxine Nausea And Vomiting    Current Outpatient Prescriptions on File Prior to Visit    Medication Sig Dispense Refill  . diltiazem (DILACOR XR) 240 MG 24 hr capsule Take 1 capsule (240 mg total) by mouth daily. 30 capsule 5  . HYDROcodone-acetaminophen (NORCO/VICODIN) 5-325 MG tablet Take 1 tablet by mouth every 6 (six) hours as needed for moderate pain. 30 tablet 0  . losartan-hydrochlorothiazide (HYZAAR) 100-25 MG tablet Take 1 tablet by mouth daily. 30 tablet 5  . meloxicam (MOBIC) 7.5 MG tablet Take 1 tablet (7.5 mg total) by mouth daily. 20 tablet 0  . metoprolol succinate (TOPROL-XL) 25 MG 24 hr tablet Take 1 tablet (25 mg total) by mouth daily. 30 tablet 5  . terbinafine (LAMISIL) 250 MG tablet Take 1 tablet (250 mg total) by mouth daily. 30 tablet 1   No current facility-administered medications on file prior to visit.    BP 153/84 mmHg  Pulse 61  Resp 16  Ht 5\' 4"  (1.626 m)  Wt 185 lb 3.2 oz (84.006 kg)  BMI 31.77 kg/m2  SpO2 97%       Objective:   Physical Exam  Constitutional: She appears well-developed and well-nourished.  Cardiovascular: Normal rate, regular  rhythm and normal heart sounds.   No murmur heard. Pulmonary/Chest: Effort normal and breath sounds normal. No respiratory distress. She has no wheezes.  Skin:  Onychomycosis right great toenail is unchanged.   Psychiatric: She has a normal mood and affect. Her behavior is normal. Judgment and thought content normal.          Assessment & Plan:

## 2015-10-13 ENCOUNTER — Telehealth: Payer: Self-pay | Admitting: Family

## 2015-10-13 ENCOUNTER — Other Ambulatory Visit: Payer: Self-pay | Admitting: Family

## 2015-10-13 DIAGNOSIS — R748 Abnormal levels of other serum enzymes: Secondary | ICD-10-CM

## 2015-10-13 NOTE — Telephone Encounter (Signed)
Please let pt know that lft's look ok, she can continue lamisil for 8 more weeks. Her alk phos is elevated. Sometimes we see this with vit D.  Would she return for vit D please? Dx elevated alk phos?

## 2015-10-13 NOTE — Telephone Encounter (Signed)
Notified pt and she voices understanding. Future order entered. She will return tomorrow afternoon for lab draw. Pt also states that neurology contacted her and she will have consultation on 10/20/15.

## 2015-10-14 ENCOUNTER — Other Ambulatory Visit: Payer: Commercial Managed Care - HMO

## 2015-10-15 ENCOUNTER — Other Ambulatory Visit: Payer: Commercial Managed Care - HMO

## 2015-10-15 ENCOUNTER — Encounter: Payer: Self-pay | Admitting: Family

## 2015-10-15 NOTE — Assessment & Plan Note (Signed)
Unchanged, complete follow up lft. Continue lamisil x 12 weeks total.

## 2015-10-15 NOTE — Assessment & Plan Note (Signed)
Uncontrolled. Add hydralazine 25mg  tid.

## 2015-10-15 NOTE — Assessment & Plan Note (Signed)
Will check status of neurosurgical referral.

## 2015-10-20 DIAGNOSIS — M5137 Other intervertebral disc degeneration, lumbosacral region: Secondary | ICD-10-CM | POA: Diagnosis not present

## 2015-11-05 ENCOUNTER — Other Ambulatory Visit: Payer: Self-pay | Admitting: Family

## 2015-11-05 ENCOUNTER — Encounter: Payer: Self-pay | Admitting: Family

## 2015-11-05 NOTE — Telephone Encounter (Signed)
Oxycodone Rx:  09/14/15, #30 Last OV: 10/12/15 Next OV: 11/11/15 UDS: none and no CSC on file.  Please advise request?

## 2015-11-06 MED ORDER — HYDROCODONE-ACETAMINOPHEN 5-325 MG PO TABS: 1.0000 | ORAL_TABLET | Freq: Four times a day (QID) | ORAL | Status: DC | PRN

## 2015-11-07 NOTE — Telephone Encounter (Signed)
Did she meet with neurosurgery?  I generally don't treat chronic pain, I would like further refills to come from neurosurgery. If she would like, I can also make a referral to pain management.

## 2015-11-09 NOTE — Telephone Encounter (Signed)
Spoke with pt and she voices understanding. She has appt with neurosurgery on 11/11/15 for injections and will discuss with them. Wanted [place] to know that is why she had to cancel f/u with PCP on 11/11/15. Previously printed Rx was shredded.

## 2015-11-11 ENCOUNTER — Other Ambulatory Visit: Payer: Self-pay | Admitting: Family

## 2015-11-11 ENCOUNTER — Ambulatory Visit: Payer: Commercial Managed Care - HMO | Admitting: Family

## 2015-11-11 DIAGNOSIS — Z6833 Body mass index (BMI) 33.0-33.9, adult: Secondary | ICD-10-CM | POA: Diagnosis not present

## 2015-11-11 DIAGNOSIS — M4806 Spinal stenosis, lumbar region: Secondary | ICD-10-CM | POA: Diagnosis not present

## 2015-11-11 DIAGNOSIS — N6489 Other specified disorders of breast: Secondary | ICD-10-CM

## 2015-11-16 ENCOUNTER — Other Ambulatory Visit: Payer: Commercial Managed Care - HMO

## 2015-11-19 ENCOUNTER — Other Ambulatory Visit: Payer: Self-pay

## 2015-11-19 MED ORDER — DILTIAZEM HCL ER 240 MG PO CP24: 240.0000 mg | ORAL_CAPSULE | Freq: Every day | ORAL | Status: DC

## 2015-11-19 MED ORDER — METOPROLOL SUCCINATE ER 25 MG PO TB24: 25.0000 mg | ORAL_TABLET | Freq: Every day | ORAL | Status: DC

## 2015-11-19 MED ORDER — LOSARTAN POTASSIUM-HCTZ 100-25 MG PO TABS: 1.0000 | ORAL_TABLET | Freq: Every day | ORAL | Status: DC

## 2015-11-19 MED ORDER — MELOXICAM 7.5 MG PO TABS: 7.5000 mg | ORAL_TABLET | Freq: Every day | ORAL | Status: DC

## 2015-11-23 ENCOUNTER — Ambulatory Visit (INDEPENDENT_AMBULATORY_CARE_PROVIDER_SITE_OTHER): Payer: Commercial Managed Care - HMO | Admitting: Family

## 2015-11-23 ENCOUNTER — Encounter: Payer: Self-pay | Admitting: Family

## 2015-11-23 VITALS — BP 130/80 | HR 76 | Temp 98.1°F | Resp 18 | Ht 64.0 in | Wt 184.2 lb

## 2015-11-23 DIAGNOSIS — G8929 Other chronic pain: Secondary | ICD-10-CM

## 2015-11-23 DIAGNOSIS — B351 Tinea unguium: Secondary | ICD-10-CM

## 2015-11-23 DIAGNOSIS — I10 Essential (primary) hypertension: Secondary | ICD-10-CM | POA: Diagnosis not present

## 2015-11-23 DIAGNOSIS — M545 Low back pain: Secondary | ICD-10-CM | POA: Diagnosis not present

## 2015-11-23 DIAGNOSIS — R748 Abnormal levels of other serum enzymes: Secondary | ICD-10-CM

## 2015-11-23 NOTE — Assessment & Plan Note (Signed)
Uncontrolled advised pt to arrange follow up with Dr. Brien Few.

## 2015-11-23 NOTE — Progress Notes (Signed)
Subjective:    Patient ID: Amy Vincent, female    DOB: [birth year], 60 y.o.   MRN: [id number]  HPI  [name] is a 60 yr old female who presents today for follow up:  1) HTN- currently maintained on toprol xl.  She denies CP or SOB.   BP Readings from Last 3 Encounters:  11/23/15 130/80  10/12/15 153/84  09/14/15 152/102   2) Onychomycosis- reports that she only took 4 weeks so far because she did not have the transportation to pick up.    3) Back pain- s/p injection 11/11/15.  Injection was performed by Dr. Brien Few. Initially her pain was improved. Reports pain started again today.    Review of Systems    see HPI  Past Medical History  Diagnosis Date  . Hypertension   . High cholesterol      Social History   Social History  . Marital Status: Single    Spouse Name: N/A  . Number of Children: N/A  . Years of Education: N/A   Occupational History  . Not on file.   Social History Main Topics  . Smoking status: Former Smoker    Quit date: 05/09/1994  . Smokeless tobacco: Never Used  . Alcohol Use: 1.8 oz/week    3 Standard drinks or equivalent per week  . Drug Use: No  . Sexual Activity: Not on file   Other Topics Concern  . Not on file   Social History Narrative   No children   On disability   Originally from [place]   Single, sister and brother liv   Lives with girlfriend    Enjoys gardening, antique "flips"    Past Surgical History  Procedure Laterality Date  . Incision and drainage      I & D of an abscess on back  . Abdominal hysterectomy  1999  . Left arm surgery  2004    orthopedic surgery left arm due to fracture as a child that healed improperly    Family History  Problem Relation Age of Onset  . Cancer Mother 44    breast  . Diabetes Father   . Cancer Maternal Aunt 41    breast  . Hypertension Paternal Uncle   . Cancer Maternal Aunt     stomach    Allergies  Allergen Reactions  . Venlafaxine Nausea And Vomiting    Current  Outpatient Prescriptions on File Prior to Visit  Medication Sig Dispense Refill  . diltiazem (DILACOR XR) 240 MG 24 hr capsule Take 1 capsule (240 mg total) by mouth daily. 30 capsule 5  . losartan-hydrochlorothiazide (HYZAAR) 100-25 MG tablet Take 1 tablet by mouth daily. 30 tablet 5  . meloxicam (MOBIC) 7.5 MG tablet Take 1 tablet (7.5 mg total) by mouth daily. 20 tablet 0  . metoprolol succinate (TOPROL-XL) 25 MG 24 hr tablet Take 1 tablet (25 mg total) by mouth daily. 30 tablet 5  . terbinafine (LAMISIL) 250 MG tablet Take 1 tablet (250 mg total) by mouth daily. 30 tablet 0   No current facility-administered medications on file prior to visit.    BP 130/80 mmHg  Pulse 76  Temp(Src) 98.1 F (36.7 C) (Oral)  Resp 18  Ht 5\' 4"  (1.626 m)  Wt 184 lb 3.2 oz (83.553 kg)  BMI 31.60 kg/m2  SpO2 98%    Objective:   Physical Exam  Constitutional: She is oriented to person, place, and time. She appears well-developed and well-nourished.  HENT:  Head: Normocephalic and atraumatic.  Cardiovascular: Normal rate, regular rhythm and normal heart sounds.   No murmur heard. Pulmonary/Chest: Effort normal and breath sounds normal. No respiratory distress. She has no wheezes.  Musculoskeletal: She exhibits no edema.  Neurological: She is alert and oriented to person, place, and time.  Skin:  New growth of left great toenail is health appearing Nailbeds surrounding fingernails appear dry  Psychiatric: She has a normal mood and affect. Her behavior is normal. Judgment and thought content normal.          Assessment & Plan:

## 2015-11-23 NOTE — Assessment & Plan Note (Signed)
Has only had 4 weeks so far, advised pt to complete 8 additional weeks. LFT were normal last month while on lamisil.

## 2015-11-23 NOTE — Progress Notes (Signed)
Pre visit review using our clinic review tool, if applicable. No additional management support is needed unless otherwise documented below in the visit note. 

## 2015-11-23 NOTE — Patient Instructions (Signed)
Restart lamisil. Complete lab work prior to leaving. Please contact Dr. Brien Few to discuss your back pain.

## 2015-11-23 NOTE — Addendum Note (Signed)
Addended by: Caffie Pinto on: 11/23/2015 04:43 PM   Modules accepted: Orders

## 2015-11-23 NOTE — Assessment & Plan Note (Addendum)
BP is stable on current medications.  Continue same. Obtain bmet.

## 2015-11-24 LAB — BASIC METABOLIC PANEL
BUN: 24 mg/dL — AB (ref 6–23)
CHLORIDE: 108 meq/L (ref 96–112)
CO2: 28 meq/L (ref 19–32)
CREATININE: 0.93 mg/dL (ref 0.40–1.20)
Calcium: 9.4 mg/dL (ref 8.4–10.5)
GFR: 78.94 mL/min (ref >60.00)
GLUCOSE: 81 mg/dL (ref 70–99)
Potassium: 4.3 mEq/L (ref 3.5–5.1)
Sodium: 142 mEq/L (ref 135–145)

## 2015-11-24 LAB — VITAMIN D 25 HYDROXY (VIT D DEFICIENCY, FRACTURES): VITD: 26.13 ng/mL — AB (ref 30.00–100.00)

## 2015-11-25 ENCOUNTER — Telehealth: Payer: Self-pay | Admitting: Family

## 2015-11-25 DIAGNOSIS — E559 Vitamin D deficiency, unspecified: Secondary | ICD-10-CM

## 2015-11-25 MED ORDER — VITAMIN D (ERGOCALCIFEROL) 1.25 MG (50000 UNIT) PO CAPS: 50000.0000 [IU] | ORAL_CAPSULE | ORAL | Status: DC

## 2015-11-25 NOTE — Telephone Encounter (Signed)
Lab order entered. Lab appt scheduled for 02/26/16 at 1:30pm. Pt aware.

## 2015-11-25 NOTE — Telephone Encounter (Signed)
Vitamin D level is low.  Advise patient to begin vit D 50000 units once weekly for 12 weeks, then repeat vit D level (dx Vit D deficiency).    I sent rx to her mail order.   Electrolytes look good.

## 2015-11-30 ENCOUNTER — Other Ambulatory Visit: Payer: Self-pay | Admitting: Family

## 2015-11-30 ENCOUNTER — Telehealth: Payer: Self-pay | Admitting: Family

## 2015-11-30 MED ORDER — TERBINAFINE HCL 250 MG PO TABS
250.0000 mg | ORAL_TABLET | Freq: Every day | ORAL | 0 refills | Status: DC
Start: 2015-11-30 — End: 2016-03-14

## 2015-11-30 NOTE — Telephone Encounter (Signed)
Melissa-- please advise lamisil refill? Vitamin D was sent to mail order.

## 2015-11-30 NOTE — Telephone Encounter (Signed)
Relation to [id number] Call back number:[phone] Pharmacy: [place], [place] [phone] (Phone) [phone] (Fax)     Reason for call:  Patient requesting a refill terbinafine (LAMISIL) 250 MG tablet

## 2015-11-30 NOTE — Telephone Encounter (Signed)
Melissa-- please see 11/30/15 refill request from pt as well. Thanks!

## 2015-12-02 ENCOUNTER — Telehealth: Payer: Self-pay

## 2015-12-02 MED ORDER — VITAMIN D (ERGOCALCIFEROL) 1.25 MG (50000 UNIT) PO CAPS: 50000.0000 [IU] | ORAL_CAPSULE | ORAL | 0 refills | Status: DC

## 2015-12-02 NOTE — Telephone Encounter (Signed)
Pt called back in to F/U on vitamin D request. She says that mail order is stating that they never received the refill request. Pt says to avoid the issue she would like to just have Rx sent to [facility] on [address] instead.

## 2015-12-02 NOTE — Telephone Encounter (Signed)
PA initiated on [email or web address]. Key#[id number] Awaiting decision.

## 2015-12-02 NOTE — Telephone Encounter (Signed)
Rx sent to [facility] and pt has been notified.

## 2015-12-09 ENCOUNTER — Encounter: Payer: Self-pay | Admitting: Family

## 2016-02-08 ENCOUNTER — Encounter: Payer: Self-pay | Admitting: Family

## 2016-02-09 ENCOUNTER — Telehealth: Payer: Self-pay | Admitting: Family

## 2016-02-09 NOTE — Telephone Encounter (Signed)
Patient is a patient at the [facility]. She is requesting to come to this office. She states she lives in [place] and it is easy for her to come here due to a ride issues she has.   Lenna Sciara is that ok with you? Baldo Ash is that ok with you?

## 2016-02-09 NOTE — Telephone Encounter (Signed)
OK with me.

## 2016-02-09 NOTE — Telephone Encounter (Signed)
Patient has an app on Nov 6@2pm  with [facility].

## 2016-02-09 NOTE — Telephone Encounter (Signed)
That is ok with me  

## 2016-02-26 ENCOUNTER — Other Ambulatory Visit: Payer: Commercial Managed Care - HMO

## 2016-03-01 ENCOUNTER — Emergency Department (HOSPITAL_COMMUNITY): Payer: Commercial Managed Care - HMO

## 2016-03-01 ENCOUNTER — Encounter (HOSPITAL_COMMUNITY): Payer: Self-pay | Admitting: Emergency Medicine

## 2016-03-01 ENCOUNTER — Emergency Department (HOSPITAL_COMMUNITY)
Admission: EM | Admit: 2016-03-01 | Discharge: 2016-03-01 | Disposition: A | Discharge status: Home / Self Care | Payer: Commercial Managed Care - HMO | Attending: Physician Assistant | Admitting: Physician Assistant

## 2016-03-01 DIAGNOSIS — I10 Essential (primary) hypertension: Secondary | ICD-10-CM | POA: Insufficient documentation

## 2016-03-01 DIAGNOSIS — R079 Chest pain, unspecified: Secondary | ICD-10-CM | POA: Diagnosis not present

## 2016-03-01 DIAGNOSIS — R0789 Other chest pain: Secondary | ICD-10-CM | POA: Diagnosis not present

## 2016-03-01 DIAGNOSIS — Z87891 Personal history of nicotine dependence: Secondary | ICD-10-CM | POA: Diagnosis not present

## 2016-03-01 DIAGNOSIS — Z79899 Other long term (current) drug therapy: Secondary | ICD-10-CM | POA: Insufficient documentation

## 2016-03-01 LAB — CBC WITH DIFFERENTIAL/PLATELET
Basophils Absolute: 0.1 10*3/uL (ref 0.0–0.1)
Basophils Relative: 1 %
Eosinophils Absolute: 0.5 10*3/uL (ref 0.0–0.7)
Eosinophils Relative: 10 %
HEMATOCRIT: 44.6 % (ref 36.0–46.0)
HEMOGLOBIN: 15.6 g/dL — AB (ref 12.0–15.0)
LYMPHS ABS: 1.9 10*3/uL (ref 0.7–4.0)
LYMPHS PCT: 36 %
MCH: 30.6 pg (ref 26.0–34.0)
MCHC: 35 g/dL (ref 30.0–36.0)
MCV: 87.5 fL (ref 78.0–100.0)
MONOS PCT: 14 %
Monocytes Absolute: 0.7 10*3/uL (ref 0.1–1.0)
NEUTROS ABS: 2 10*3/uL (ref 1.7–7.7)
NEUTROS PCT: 39 %
Platelets: 224 10*3/uL (ref 150–400)
RBC: 5.1 MIL/uL (ref 3.87–5.11)
RDW: 12.6 % (ref 11.5–15.5)
WBC: 5.2 10*3/uL (ref 4.0–10.5)

## 2016-03-01 LAB — COMPREHENSIVE METABOLIC PANEL
ALBUMIN: 3.8 g/dL (ref 3.5–5.0)
ALT: 19 U/L (ref 14–54)
ANION GAP: 6 (ref 5–15)
AST: 16 U/L (ref 15–41)
Alkaline Phosphatase: 106 U/L (ref 38–126)
BUN: 9 mg/dL (ref 6–20)
CHLORIDE: 109 mmol/L (ref 101–111)
CO2: 25 mmol/L (ref 22–32)
Calcium: 9.4 mg/dL (ref 8.9–10.3)
Creatinine, Ser: 0.73 mg/dL (ref 0.44–1.00)
GFR calc non Af Amer: >60 mL/min (ref >60)
Glucose, Bld: 99 mg/dL (ref 65–99)
Potassium: 4.1 mmol/L (ref 3.5–5.1)
SODIUM: 140 mmol/L (ref 135–145)
Total Bilirubin: 0.9 mg/dL (ref 0.3–1.2)
Total Protein: 6.7 g/dL (ref 6.5–8.1)

## 2016-03-01 LAB — I-STAT TROPONIN, ED
TROPONIN I, POC: 0 ng/mL (ref 0.00–0.08)
Troponin i, poc: 0 ng/mL (ref 0.00–0.08)

## 2016-03-01 MED ORDER — CYCLOBENZAPRINE HCL 10 MG PO TABS
10.0000 mg | ORAL_TABLET | Freq: Once | ORAL | Status: AC
  Administered 2016-03-01: 10 mg via ORAL
  Filled 2016-03-01: qty 1

## 2016-03-01 MED ORDER — IBUPROFEN 800 MG PO TABS: 800.0000 mg | ORAL_TABLET | Freq: Three times a day (TID) | ORAL | 0 refills | Status: DC

## 2016-03-01 MED ORDER — CYCLOBENZAPRINE HCL 10 MG PO TABS: 10.0000 mg | ORAL_TABLET | Freq: Two times a day (BID) | ORAL | 0 refills | Status: DC | PRN

## 2016-03-01 MED ORDER — ASPIRIN 81 MG PO CHEW
324.0000 mg | CHEWABLE_TABLET | Freq: Once | ORAL | Status: AC
  Administered 2016-03-01: 324 mg via ORAL
  Filled 2016-03-01: qty 4

## 2016-03-01 NOTE — ED Notes (Signed)
Pt complaining of right sided chest pain. Hurts more when moving.  Hx of Hypertension.

## 2016-03-01 NOTE — Discharge Instructions (Signed)
We did several tests today's  which make [place] feel comfortable that this is not likely to be cardiac chest pain. We will need tyou to continue your blood pressure medications as prescribed and follow-up with her primary care physician. We also gave your referral to cardiologist because yuou may need to have what is called stress testing to make sure there is no stress to your heart.

## 2016-03-01 NOTE — ED Triage Notes (Signed)
Pt sts right sided CP worse with inspiration and cough x 2 days; pt denies SOB

## 2016-03-01 NOTE — ED Provider Notes (Signed)
[facility] DEPT Provider Note   CSN: [id number] Arrival date & time: 03/01/16  0932     History   Chief Complaint Chief Complaint  Patient presents with  . Chest Pain    HPI Amy Vincent is a 60 y.o. female.  HPI   Patient is a 75-year-old female with past medical history significant for depression, chronic pain, hypertension hyperlipidemia. Patient recently switching her primary care physicians. She reports that she's not been taking her hypertensive medication for the last several weeks. She reports that she has chest pain in her right chest wall worse with moving her right arm. Worse with deep breaths.  No PE risk factors such as long car trips, estrogens, immobility or surgeries.  Patient has history of hypertension, no hyperlipidemia no diabetes and no family history of early cardiac death.  Past Medical History:  Diagnosis Date  . High cholesterol   . Hypertension     Patient Active Problem List   Diagnosis Date Noted  . Onychomycosis 08/12/2015  . Depression 04/24/2015  . Chronic low back pain 03/25/2015  . HTN (hypertension) 03/25/2015  . Hyperlipidemia 03/25/2015    Past Surgical History:  Procedure Laterality Date  . ABDOMINAL HYSTERECTOMY  1999  . INCISION AND DRAINAGE     I & D of an abscess on back  . left arm surgery  2004   orthopedic surgery left arm due to fracture as a child that healed improperly    OB History    No data available       Home Medications    Prior to Admission medications   Medication Sig Start Date End Date Taking? Authorizing Provider  diltiazem (DILACOR XR) 240 MG 24 hr capsule Take 1 capsule (240 mg total) by mouth daily. 11/19/15  Yes Debbrah Alar, NP  losartan-hydrochlorothiazide (HYZAAR) 100-25 MG tablet Take 1 tablet by mouth daily. 11/19/15  Yes Debbrah Alar, NP  meloxicam (MOBIC) 7.5 MG tablet Take 1 tablet (7.5 mg total) by mouth daily. 11/19/15  Yes Debbrah Alar, NP  metoprolol succinate  (TOPROL-XL) 25 MG 24 hr tablet Take 1 tablet (25 mg total) by mouth daily. 11/19/15  Yes Debbrah Alar, NP  terbinafine (LAMISIL) 250 MG tablet Take 1 tablet (250 mg total) by mouth daily. 11/30/15  Yes Debbrah Alar, NP  Vitamin D, Ergocalciferol, (DRISDOL) 50000 units CAPS capsule Take 1 capsule (50,000 Units total) by mouth every 7 (seven) days. 12/02/15  Yes Debbrah Alar, NP  cyclobenzaprine (FLEXERIL) 10 MG tablet Take 1 tablet (10 mg total) by mouth 2 (two) times daily as needed for muscle spasms. 03/01/16   Lazaria Schaben Lyn Olar Santini, MD  ibuprofen (ADVIL,MOTRIN) 800 MG tablet Take 1 tablet (800 mg total) by mouth 3 (three) times daily. 03/01/16   Amelia Macken Lyn Sady Monaco, MD    Family History Family History  Problem Relation Age of Onset  . Cancer Mother 72    breast  . Diabetes Father   . Cancer Maternal Aunt 63    breast  . Hypertension Paternal Uncle   . Cancer Maternal Aunt     stomach    Social History Social History  Substance Use Topics  . Smoking status: Former Smoker    Quit date: 05/09/1994  . Smokeless tobacco: Never Used  . Alcohol use 1.8 oz/week    3 Standard drinks or equivalent per week     Allergies   Review of patient's allergies indicates no active allergies.   Review of Systems Review of Systems  Constitutional:  Negative for fatigue and fever.  Respiratory: Negative for shortness of breath.   Cardiovascular: Positive for chest pain. Negative for palpitations and leg swelling.  Gastrointestinal: Negative for abdominal pain.  Neurological: Negative for dizziness.  Psychiatric/Behavioral: Negative for confusion.  All other systems reviewed and are negative.    Physical Exam Updated Vital Signs BP 191/99   Pulse (!) 59   Temp 98.2 F (36.8 C) (Oral)   Resp 24   SpO2 99%   Physical Exam  Constitutional: She is oriented to person, place, and time. She appears well-developed and well-nourished.  HENT:  Head: Normocephalic and  atraumatic.  Eyes: Right eye exhibits no discharge.  Cardiovascular: Normal rate, regular rhythm and normal heart sounds.   No murmur heard. Pulmonary/Chest: Effort normal and breath sounds normal. She has no wheezes. She has no rales.  Abdominal: Soft. She exhibits no distension. There is no tenderness.  Neurological: She is oriented to person, place, and time.  Skin: Skin is warm and dry. She is not diaphoretic.  Psychiatric: She has a normal mood and affect.  Nursing note and vitals reviewed.    ED Treatments / Results  Labs (all labs ordered are listed, but only abnormal results are displayed) Labs Reviewed  CBC WITH DIFFERENTIAL/PLATELET - Abnormal; Notable for the following:       Result Value   Hemoglobin 15.6 (*)    All other components within normal limits  COMPREHENSIVE METABOLIC PANEL  I-STAT TROPOININ, ED  Randolm Idol, ED    EKG  EKG Interpretation  Date/Time:  Tuesday March 01 2016 09:35:36 EDT Ventricular Rate:  68 PR Interval:  142 QRS Duration: 70 QT Interval:  428 QTC Calculation: 455 R Axis:   -25 Text Interpretation:  Normal sinus rhythm Biatrial enlargement Abnormal ECG No significant change since last tracing Confirmed by Gerald Leitz ([id number]) on 03/01/2016 10:02:51 AM       Radiology Dg Chest 2 View  Result Date: 03/01/2016 CLINICAL DATA:  Chest pain EXAM: CHEST  2 VIEW COMPARISON:  05/08/2015 FINDINGS: The heart size and mediastinal contours are within normal limits. Both lungs are clear. The visualized skeletal structures are unremarkable. IMPRESSION: No active cardiopulmonary disease. Electronically Signed   By: Franchot Gallo M.D.   On: 03/01/2016 10:47    Procedures Procedures (including critical care time)  Medications Ordered in ED Medications  aspirin chewable tablet 324 mg (324 mg Oral Given 03/01/16 1056)  cyclobenzaprine (FLEXERIL) tablet 10 mg (10 mg Oral Given 03/01/16 1056)     Initial Impression / Assessment and  Plan / ED Course  I have reviewed the triage vital signs and the nursing notes.  Pertinent labs & imaging results that were available during my care of the patient were reviewed by me and considered in my medical decision making (see chart for details).  Clinical Course   Patient is a pleasant 60 year old female with history of hypertension presenting with chest pain. It sounds muscular skeletal in nature given its worse with movement and deep breaths. Patient has no PE risk factors therefore doubt PE. Patient has a low heart score and a delta troponin will be sufficient to rule out cardiac ischemia as an ideology. The pain comes and goes and is really associated with movement therefore cardiac etiology is lower on my differential. We will encourage patient to take her blood pressure medicines.  Delta trp negative.  Will have her follow up with her PMD this week.   Final Clinical Impressions(s) / ED Diagnoses  Final diagnoses:  Chest pain, unspecified type    New Prescriptions Discharge Medication List as of 03/01/2016  2:49 PM    START taking these medications   Details  cyclobenzaprine (FLEXERIL) 10 MG tablet Take 1 tablet (10 mg total) by mouth 2 (two) times daily as needed for muscle spasms., Starting Tue 03/01/2016, Print    ibuprofen (ADVIL,MOTRIN) 800 MG tablet Take 1 tablet (800 mg total) by mouth 3 (three) times daily., Starting Tue 03/01/2016, Print         Aliciana Ricciardi Julio Alm, MD 03/01/16 1550

## 2016-03-14 ENCOUNTER — Ambulatory Visit (INDEPENDENT_AMBULATORY_CARE_PROVIDER_SITE_OTHER): Payer: Commercial Managed Care - HMO | Admitting: Nurse Practitioner

## 2016-03-14 ENCOUNTER — Encounter: Payer: Self-pay | Admitting: Nurse Practitioner

## 2016-03-14 VITALS — BP 142/102 | HR 78 | Temp 98.0°F | Ht 64.0 in | Wt 186.0 lb

## 2016-03-14 DIAGNOSIS — M5441 Lumbago with sciatica, right side: Secondary | ICD-10-CM

## 2016-03-14 DIAGNOSIS — I10 Essential (primary) hypertension: Secondary | ICD-10-CM

## 2016-03-14 DIAGNOSIS — G8929 Other chronic pain: Secondary | ICD-10-CM | POA: Diagnosis not present

## 2016-03-14 NOTE — Patient Instructions (Signed)
Please contact neurosurgeon for further management of back pain. Consider use or oral prednisone (dose pack) and/or gabapentin if neurosurgeon does not provided any additional pain management. Resume BP medications as prescribed.

## 2016-03-14 NOTE — Progress Notes (Signed)
Pre visit review using our clinic review tool, if applicable. No additional management support is needed unless otherwise documented below in the visit note. 

## 2016-03-14 NOTE — Progress Notes (Signed)
Subjective:  Patient ID: Amy Vincent, female    DOB: [birth year]  Age: 60 y.o. MRN: [id number]  CC: Back Pain (Pt stated lower back right side is painful/itching for 6 years and need PCP)   Back Pain  This is a chronic problem. The current episode started more than 1 year ago. The problem occurs intermittently. The problem has been waxing and waning since onset. The pain is present in the lumbar spine and gluteal (right side). The pain radiates to the right thigh. The pain is worse during the night. The symptoms are aggravated by lying down (laying on right side). Stiffness is present in the morning. Associated symptoms include leg pain. Pertinent negatives include no bladder incontinence, bowel incontinence, dysuria, numbness, paresis, paresthesias, pelvic pain, perianal numbness, tingling, weakness or weight loss. Risk factors include obesity, menopause and lack of exercise. She has tried analgesics, bed rest, heat, home exercises, muscle relaxant and NSAIDs for the symptoms. The treatment provided no relief.   HTN: She stopped taking medications for 63months. Resumed medications 03/09/2016.  Depression: Under control. Waxing and waning. She does not want any medication at this time. Denies any SI or HI Hx of substance and ETOH abuse, 20years clean  Back pain: Onset 2011. Referred to neurosurgeon who injected right side of lumbar spine 10/12/2015. No improvement after injection. She has not contacted specialist for further instructions. Has used NSAIDs, tylenol, hydrocodone and flexeril in past with no improvement. Outpatient PT  x37month also done with no improvement. Currently used meloxicam prn. Lumbar spine DG and MRI done within the last 48months.   Outpatient Medications Prior to Visit  Medication Sig Dispense Refill  . losartan-hydrochlorothiazide (HYZAAR) 100-25 MG tablet Take 1 tablet by mouth daily. 30 tablet 5  . metoprolol succinate (TOPROL-XL) 25 MG 24 hr tablet Take 1  tablet (25 mg total) by mouth daily. 30 tablet 5  . terbinafine (LAMISIL) 250 MG tablet Take 1 tablet (250 mg total) by mouth daily. 60 tablet 0  . diltiazem (DILACOR XR) 240 MG 24 hr capsule Take 1 capsule (240 mg total) by mouth daily. (Patient not taking: Reported on 03/14/2016) 30 capsule 5  . meloxicam (MOBIC) 7.5 MG tablet Take 1 tablet (7.5 mg total) by mouth daily. (Patient not taking: Reported on 03/14/2016) 20 tablet 0  . cyclobenzaprine (FLEXERIL) 10 MG tablet Take 1 tablet (10 mg total) by mouth 2 (two) times daily as needed for muscle spasms. 10 tablet 0  . ibuprofen (ADVIL,MOTRIN) 800 MG tablet Take 1 tablet (800 mg total) by mouth 3 (three) times daily. (Patient not taking: Reported on 03/14/2016) 21 tablet 0  . Vitamin D, Ergocalciferol, (DRISDOL) 50000 units CAPS capsule Take 1 capsule (50,000 Units total) by mouth every 7 (seven) days. 12 capsule 0   No facility-administered medications prior to visit.     ROS See HPI  Objective:  BP (!) 142/102 (BP Location: Left Arm, Patient Position: Sitting, Cuff Size: Normal)   Pulse 78   Temp 98 F (36.7 C)   Ht 5\' 4"  (1.626 m)   Wt 186 lb (84.4 kg)   SpO2 98%   BMI 31.93 kg/m   BP Readings from Last 3 Encounters:  03/14/16 (!) 142/102  03/01/16 191/99  11/23/15 130/80    Wt Readings from Last 3 Encounters:  03/14/16 186 lb (84.4 kg)  11/23/15 184 lb 3.2 oz (83.6 kg)  10/12/15 185 lb 3.2 oz (84 kg)    Physical Exam  Constitutional: She is oriented  to person, place, and time. No distress.  Neck: Normal range of motion. Neck supple.  Cardiovascular: Normal rate and normal heart sounds.   Pulmonary/Chest: Effort normal and breath sounds normal.  Abdominal: Soft. Bowel sounds are normal.  Musculoskeletal: She exhibits tenderness. She exhibits no edema.       Right hip: She exhibits decreased range of motion and tenderness. She exhibits normal strength and no bony tenderness.       Right knee: Normal.       Right ankle:  Normal.       Lumbar back: She exhibits tenderness, pain and spasm. She exhibits normal range of motion, no bony tenderness and normal pulse.       Back:  Neurological: She is alert and oriented to person, place, and time.  Vitals reviewed.   Lab Results  Component Value Date   WBC 5.2 03/01/2016   HGB 15.6 (H) 03/01/2016   HCT 44.6 03/01/2016   PLT 224 03/01/2016   GLUCOSE 99 03/01/2016   CHOL 200 03/25/2015   TRIG 103.0 03/25/2015   HDL 47.80 03/25/2015   LDLCALC 132 (H) 03/25/2015   ALT 19 03/01/2016   AST 16 03/01/2016   NA 140 03/01/2016   K 4.1 03/01/2016   CL 109 03/01/2016   CREATININE 0.73 03/01/2016   BUN 9 03/01/2016   CO2 25 03/01/2016   TSH 0.79 04/24/2015    Dg Chest 2 View  Result Date: 03/01/2016 CLINICAL DATA:  Chest pain EXAM: CHEST  2 VIEW COMPARISON:  05/08/2015 FINDINGS: The heart size and mediastinal contours are within normal limits. Both lungs are clear. The visualized skeletal structures are unremarkable. IMPRESSION: No active cardiopulmonary disease. Electronically Signed   By: Franchot Gallo M.D.   On: 03/01/2016 10:47    Assessment & Plan:   Amy Vincent was seen today for back pain.  Diagnoses and all orders for this visit:  Essential hypertension  Chronic right-sided low back pain with right-sided sciatica   I have discontinued Amy Vincent's terbinafine, Vitamin D (Ergocalciferol), cyclobenzaprine, and ibuprofen. I am also having her maintain her diltiazem, losartan-hydrochlorothiazide, meloxicam, and metoprolol succinate.  No orders of the defined types were placed in this encounter.   Follow-up: Return in about 1 month (around 04/13/2016) for CPE.  Amy Lacy, NP

## 2016-03-15 ENCOUNTER — Encounter: Payer: Self-pay | Admitting: Nurse Practitioner

## 2016-04-19 ENCOUNTER — Other Ambulatory Visit (INDEPENDENT_AMBULATORY_CARE_PROVIDER_SITE_OTHER): Payer: Commercial Managed Care - HMO

## 2016-04-19 ENCOUNTER — Ambulatory Visit (INDEPENDENT_AMBULATORY_CARE_PROVIDER_SITE_OTHER): Payer: Commercial Managed Care - HMO | Admitting: Nurse Practitioner

## 2016-04-19 ENCOUNTER — Encounter: Payer: Self-pay | Admitting: Nurse Practitioner

## 2016-04-19 VITALS — BP 142/96 | HR 66 | Temp 98.1°F | Ht 63.5 in | Wt 191.0 lb

## 2016-04-19 DIAGNOSIS — E782 Mixed hyperlipidemia: Secondary | ICD-10-CM

## 2016-04-19 DIAGNOSIS — Z800 Family history of malignant neoplasm of digestive organs: Secondary | ICD-10-CM

## 2016-04-19 DIAGNOSIS — Z1211 Encounter for screening for malignant neoplasm of colon: Secondary | ICD-10-CM | POA: Diagnosis not present

## 2016-04-19 DIAGNOSIS — N63 Unspecified lump in unspecified breast: Secondary | ICD-10-CM | POA: Insufficient documentation

## 2016-04-19 DIAGNOSIS — Z23 Encounter for immunization: Secondary | ICD-10-CM

## 2016-04-19 DIAGNOSIS — Z0001 Encounter for general adult medical examination with abnormal findings: Secondary | ICD-10-CM

## 2016-04-19 DIAGNOSIS — Z803 Family history of malignant neoplasm of breast: Secondary | ICD-10-CM | POA: Diagnosis not present

## 2016-04-19 DIAGNOSIS — R739 Hyperglycemia, unspecified: Secondary | ICD-10-CM

## 2016-04-19 DIAGNOSIS — Z0000 Encounter for general adult medical examination without abnormal findings: Secondary | ICD-10-CM | POA: Diagnosis not present

## 2016-04-19 DIAGNOSIS — F329 Major depressive disorder, single episode, unspecified: Secondary | ICD-10-CM

## 2016-04-19 DIAGNOSIS — H6983 Other specified disorders of Eustachian tube, bilateral: Secondary | ICD-10-CM

## 2016-04-19 DIAGNOSIS — H6993 Unspecified Eustachian tube disorder, bilateral: Secondary | ICD-10-CM

## 2016-04-19 DIAGNOSIS — I10 Essential (primary) hypertension: Secondary | ICD-10-CM

## 2016-04-19 DIAGNOSIS — F32A Depression, unspecified: Secondary | ICD-10-CM

## 2016-04-19 LAB — LIPID PANEL
CHOLESTEROL: 239 mg/dL — AB (ref 0–200)
HDL: 57 mg/dL (ref >39.00)
LDL Cholesterol: 160 mg/dL — ABNORMAL HIGH (ref 0–99)
NonHDL: 182.3
TRIGLYCERIDES: 111 mg/dL (ref 0.0–149.0)
Total CHOL/HDL Ratio: 4
VLDL: 22.2 mg/dL (ref 0.0–40.0)

## 2016-04-19 LAB — HEMOGLOBIN A1C: HEMOGLOBIN A1C: 5.8 % (ref 4.6–6.5)

## 2016-04-19 MED ORDER — DILTIAZEM HCL ER 240 MG PO CP24: 240.0000 mg | ORAL_CAPSULE | Freq: Every day | ORAL | 5 refills | Status: DC

## 2016-04-19 MED ORDER — METOPROLOL SUCCINATE ER 25 MG PO TB24: 25.0000 mg | ORAL_TABLET | Freq: Every day | ORAL | 5 refills | Status: DC

## 2016-04-19 MED ORDER — LOSARTAN POTASSIUM-HCTZ 100-25 MG PO TABS: 1.0000 | ORAL_TABLET | Freq: Every day | ORAL | 5 refills | Status: DC

## 2016-04-19 MED ORDER — CETIRIZINE HCL 10 MG PO TABS: 10.0000 mg | ORAL_TABLET | Freq: Every day | ORAL | 0 refills | Status: DC

## 2016-04-19 NOTE — Patient Instructions (Signed)
Female  Preventive Care 23-64 Years, Female Preventive care refers to lifestyle choices and visits with your health care provider that can promote health and wellness. What does preventive care include?  A yearly physical exam. This is also called an annual well check.  Dental exams once or twice a year.  Routine eye exams. Ask your health care provider how often you should have your eyes checked.  Personal lifestyle choices, including:  Daily care of your teeth and gums.  Regular physical activity.  Eating a healthy diet.  Avoiding tobacco and drug use.  Limiting alcohol use.  Practicing safe sex.  Taking low-dose aspirin daily starting at age 3.  Taking vitamin and mineral supplements as recommended by your health care provider. What happens during an annual well check? The services and screenings done by your health care provider during your annual well check will depend on your age, overall health, lifestyle risk factors, and family history of disease. Counseling  Your health care provider may ask you questions about your:  Alcohol use.  Tobacco use.  Drug use.  Emotional well-being.  Home and relationship well-being.  Sexual activity.  Eating habits.  Work and work Statistician.  Method of birth control.  Menstrual cycle.  Pregnancy history. Screening  You may have the following tests or measurements:  Height, weight, and BMI.  Blood pressure.  Lipid and cholesterol levels. These may be checked every 5 years, or more frequently if you are over 58 years old.  Skin check.  Lung cancer screening. You may have this screening every year starting at age 51 if you have a 30-pack-year history of smoking and currently smoke or have quit within the past 15 years.  Fecal occult blood test (FOBT) of the stool. You may have this test every year starting at age 61.  Flexible sigmoidoscopy or colonoscopy. You may have a sigmoidoscopy every 5 years or a  colonoscopy every 10 years starting at age 30.  Hepatitis C blood test.  Hepatitis B blood test.  Sexually transmitted disease (STD) testing.  Diabetes screening. This is done by checking your blood sugar (glucose) after you have not eaten for a while (fasting). You may have this done every 1-3 years.  Mammogram. This may be done every 1-2 years. Talk to your health care provider about when you should start having regular mammograms. This may depend on whether you have a family history of breast cancer.  BRCA-related cancer screening. This may be done if you have a family history of breast, ovarian, tubal, or peritoneal cancers.  Pelvic exam and Pap test. This may be done every 3 years starting at age 29. Starting at age 34, this may be done every 5 years if you have a Pap test in combination with an HPV test.  Bone density scan. This is done to screen for osteoporosis. You may have this scan if you are at high risk for osteoporosis. Discuss your test results, treatment options, and if necessary, the need for more tests with your health care provider. Vaccines  Your health care provider may recommend certain vaccines, such as:  Influenza vaccine. This is recommended every year.  Tetanus, diphtheria, and acellular pertussis (Tdap, Td) vaccine. You may need a Td booster every 10 years.  Varicella vaccine. You may need this if you have not been vaccinated.  Zoster vaccine. You may need this after age 49.  Measles, mumps, and rubella (MMR) vaccine. You may need at least one dose of MMR if you  1957 or later. You may also need a second dose.  Pneumococcal 13-valent conjugate (PCV13) vaccine. You may need this if you have certain conditions and were not previously vaccinated.  Pneumococcal polysaccharide (PPSV23) vaccine. You may need one or two doses if you smoke cigarettes or if you have certain conditions.  Meningococcal vaccine. You may need this if you have certain  conditions.  Hepatitis A vaccine. You may need this if you have certain conditions or if you travel or work in places where you may be exposed to hepatitis A.  Hepatitis B vaccine. You may need this if you have certain conditions or if you travel or work in places where you may be exposed to hepatitis B.  Haemophilus influenzae type b (Hib) vaccine. You may need this if you have certain conditions.  Talk to your health care provider about which screenings and vaccines you need and how often you need them. This information is not intended to replace advice given to you by your health care provider. Make sure you discuss any questions you have with your health care provider. Document Released: 05/22/2015 Document Revised: 01/13/2016 Document Reviewed: 02/24/2015 Elsevier Interactive Patient Education  2017 [employer].  

## 2016-04-19 NOTE — Progress Notes (Signed)
Pre visit review using our clinic review tool, if applicable. No additional management support is needed unless otherwise documented below in the visit note. 

## 2016-04-19 NOTE — Assessment & Plan Note (Signed)
Left breast

## 2016-04-19 NOTE — Progress Notes (Signed)
Subjective:    Patient ID: Amy Vincent, female    DOB: 1956-03-21, 60 y.o.   MRN: [id number]  Patient presents today for complete physical or establish care (new patient) and med refill.  HPI  Immunizations: (TDAP, Hep C screen, Pneumovax, Influenza, zoster)  Health Maintenance  Topic Date Due  .  Hepatitis C: One time screening is recommended by Center for Disease Control  (CDC) for  adults born from [birth year] through 1965.   1956/04/10  . HIV Screening  05/11/1970  . Colon Cancer Screening  05/09/2014  . Shingles Vaccine  05/12/2015  . Flu Shot  08/06/2016*  . Mammogram  05/12/2017  . Tetanus Vaccine  04/19/2026  *Topic was postponed. The date shown is not the original due date.   Diet:regular Weight:  Wt Readings from Last 3 Encounters:  04/19/16 191 lb (86.6 kg)  03/14/16 186 lb (84.4 kg)  11/23/15 184 lb 3.2 oz (83.6 kg)   Exercise:none Fall Risk: No flowsheet data found. Home Safety:home alone Depression/Suicide:denies No flowsheet data found. No flowsheet data found. Colonoscopy (every 5-68yr, >50-774yr:needs referral Pap Smear (every 3y69yror >21-29 without HPV, every 58yr21yrr >30-658yr54yrh HPV):s/p hysterectomy, no hx of abnormal PAP, no FH of cervical cancer Mammogram (yearly, >458yrs71yrds referral for repeat diagnostic Vision:needed Dental:needed Advanced Directive: Advanced Directives 03/01/2016  Does Patient Have a Medical Advance Directive? No  Would patient like information on creating a medical advance directive? No - patient declined information   Sexual History (birth control, marital status, STD):sexuall active, s/p hysterectomy.  Medications and allergies reviewed with patient and updated if appropriate.  Patient Active Problem List   Diagnosis Date Noted  . Breast mass seen on mammogram 04/19/2016  . FH: breast cancer in first degree relative when <50 yea65 old 04/19/2016  . Eustachian tube dysfunction, bilateral 04/19/2016  .  Hyperglycemia 04/19/2016  . Depression 04/24/2015  . Chronic low back pain 03/25/2015  . HTN (hypertension) 03/25/2015  . Hyperlipidemia 03/25/2015    Current Outpatient Prescriptions on File Prior to Visit  Medication Sig Dispense Refill  . meloxicam (MOBIC) 7.5 MG tablet Take 1 tablet (7.5 mg total) by mouth daily. 20 tablet 0   No current facility-administered medications on file prior to visit.     Past Medical History:  Diagnosis Date  . High cholesterol   . Hypertension     Past Surgical History:  Procedure Laterality Date  . ABDOMINAL HYSTERECTOMY  1999  . INCISION AND DRAINAGE     I & D of an abscess on back  . left arm surgery  2004   orthopedic surgery left arm due to fracture as a child that healed improperly    Social History   Social History  . Marital status: Single    Spouse name: N/A  . Number of children: N/A  . Years of education: N/A   Social History Main Topics  . Smoking status: Former Smoker    Quit date: 05/09/1994  . Smokeless tobacco: Never Used  . Alcohol use 1.8 oz/week    3 Standard drinks or equivalent per week  . Drug use: No  . Sexual activity: Not Asked   Other Topics Concern  . None   Social History Narrative   No children   On disability   Originally from [place]   [place]   Lives with girlfriend    Enjoys gardening, antique "flips"    Family History  Problem Relation Age of Onset  .  Cancer Mother 59    breast  . Diabetes Father   . Cancer Maternal Aunt 69    breast  . Cancer Maternal Aunt     stomach  . Hypertension Paternal Uncle   . Cancer Paternal Uncle 58    colon cancer        Review of Systems  Constitutional: Negative for fever, malaise/fatigue and weight loss.  HENT: Negative for congestion and sore throat.   Eyes:       Negative for visual changes  Respiratory: Negative for cough and shortness of breath.   Cardiovascular: Negative for chest pain, palpitations and leg swelling.    Gastrointestinal: Negative for blood in stool, constipation, diarrhea and heartburn.  Genitourinary: Negative for dysuria, frequency and urgency.  Musculoskeletal: Negative for falls, joint pain and myalgias.  Skin: Negative for rash.  Neurological: Negative for dizziness, sensory change and headaches.  Endo/Heme/Allergies: Does not bruise/bleed easily.  Psychiatric/Behavioral: Negative for depression, substance abuse and suicidal ideas. The patient is not nervous/anxious.     Objective:   Vitals:   04/19/16 0933  BP: (!) 142/96  Pulse: 66  Temp: 98.1 F (36.7 C)    Body mass index is 33.3 kg/m.   Physical Examination:  Physical Exam  Constitutional: She is oriented to person, place, and time and well-developed, well-nourished, and in no distress. No distress.  HENT:  Right Ear: External ear normal.  Left Ear: External ear normal.  Nose: Nose normal.  Mouth/Throat: Oropharynx is clear and moist. No oropharyngeal exudate.  Eyes: Conjunctivae and EOM are normal. Pupils are equal, round, and reactive to light. No scleral icterus.  Neck: Normal range of motion. Neck supple. No thyromegaly present.  Cardiovascular: Normal rate, normal heart sounds and intact distal pulses.   Pulmonary/Chest: Effort normal and breath sounds normal. She exhibits no tenderness. Right breast exhibits inverted nipple and tenderness. Right breast exhibits no mass, no nipple discharge and no skin change. Left breast exhibits mass and tenderness. Left breast exhibits no inverted nipple, no nipple discharge and no skin change.    Abdominal: Soft. Bowel sounds are normal. She exhibits no distension. There is no tenderness.  Musculoskeletal: Normal range of motion. She exhibits no edema or tenderness.  Lymphadenopathy:    She has no cervical adenopathy.  Neurological: She is alert and oriented to person, place, and time. Gait normal.  Skin: Skin is warm and dry.  Psychiatric: Affect and judgment normal.     ASSESSMENT and PLAN:  Adalin was seen today for annual exam.  Diagnoses and all orders for this visit:  Encounter for preventative adult health care exam with abnormal findings -     Hepatitis C Antibody; Future -     HIV antibody; Future  Breast mass seen on mammogram -     US BREAST LTD UNI LEFT INC AXILLA; Future -     MM Digital Diagnostic Unilat L; Future  FH: breast cancer in first degree relative when <54 years old -     US BREAST LTD UNI LEFT INC AXILLA; Future -     MM Digital Diagnostic Unilat L; Future  Family history of colon cancer requiring screening colonoscopy -     Ambulatory referral to Gastroenterology  Colon cancer screening -     Ambulatory referral to Gastroenterology  Mixed hyperlipidemia -     Lipid panel; Future -     atorvastatin (LIPITOR) 20 MG tablet; Take 1 tablet (20 mg total) by mouth daily at 6 PM.  Essential hypertension -     diltiazem (DILACOR XR) 240 MG 24 hr capsule; Take 1 capsule (240 mg total) by mouth daily. -     losartan-hydrochlorothiazide (HYZAAR) 100-25 MG tablet; Take 1 tablet by mouth daily. -     metoprolol succinate (TOPROL-XL) 25 MG 24 hr tablet; Take 1 tablet (25 mg total) by mouth daily.  Depression, unspecified depression type  Eustachian tube dysfunction, bilateral -     cetirizine (ZYRTEC) 10 MG tablet; Take 1 tablet (10 mg total) by mouth daily.  Hyperglycemia -     Hemoglobin A1c; Future  Need for prophylactic vaccination with combined diphtheria-tetanus-pertussis (DTP) vaccine -     Tdap vaccine greater than or equal to 7yo IM   Breast mass seen on mammogram Left breast     Recent Results (from the past 2160 hour(s))  Comprehensive metabolic panel     Status: None   Collection Time: 03/01/16 10:15 AM  Result Value Ref Range   Sodium 140 135 - 145 mmol/L   Potassium 4.1 3.5 - 5.1 mmol/L   Chloride 109 101 - 111 mmol/L   CO2 25 22 - 32 mmol/L   Glucose, Bld 99 65 - 99 mg/dL   BUN 9 6 - 20 mg/dL    Creatinine, Ser 0.73 0.44 - 1.00 mg/dL   Calcium 9.4 8.9 - 10.3 mg/dL   Total Protein 6.7 6.5 - 8.1 g/dL   Albumin 3.8 3.5 - 5.0 g/dL   AST 16 15 - 41 U/L   ALT 19 14 - 54 U/L   Alkaline Phosphatase 106 38 - 126 U/L   Total Bilirubin 0.9 0.3 - 1.2 mg/dL   GFR calc non Af Amer >60 >60 mL/min   GFR calc Af Amer >60 >60 mL/min    Comment: (NOTE) The eGFR has been calculated using the CKD EPI equation. This calculation has not been validated in all clinical situations. eGFR's persistently <60 mL/min signify possible Chronic Kidney Disease.    Anion gap 6 5 - 15  CBC with Differential/Platelet     Status: Abnormal   Collection Time: 03/01/16 10:15 AM  Result Value Ref Range   WBC 5.2 4.0 - 10.5 K/uL   RBC 5.10 3.87 - 5.11 MIL/uL   Hemoglobin 15.6 (H) 12.0 - 15.0 g/dL   HCT 44.6 36.0 - 46.0 %   MCV 87.5 78.0 - 100.0 fL   MCH 30.6 26.0 - 34.0 pg   MCHC 35.0 30.0 - 36.0 g/dL   RDW 12.6 11.5 - 15.5 %   Platelets 224 150 - 400 K/uL   Neutrophils Relative % 39 %   Neutro Abs 2.0 1.7 - 7.7 K/uL   Lymphocytes Relative 36 %   Lymphs Abs 1.9 0.7 - 4.0 K/uL   Monocytes Relative 14 %   Monocytes Absolute 0.7 0.1 - 1.0 K/uL   Eosinophils Relative 10 %   Eosinophils Absolute 0.5 0.0 - 0.7 K/uL   Basophils Relative 1 %   Basophils Absolute 0.1 0.0 - 0.1 K/uL  I-stat troponin, ED     Status: None   Collection Time: 03/01/16 10:19 AM  Result Value Ref Range   Troponin i, poc 0.00 0.00 - 0.08 ng/mL   Comment 3            Comment: Due to the release kinetics of cTnI, a negative result within the first hours of the onset of symptoms does not rule out myocardial infarction with certainty. If myocardial infarction is still suspected, repeat  the test at appropriate intervals.   I-stat troponin, ED     Status: None   Collection Time: 03/01/16  1:56 PM  Result Value Ref Range   Troponin i, poc 0.00 0.00 - 0.08 ng/mL   Comment 3            Comment: Due to the release kinetics of cTnI, a  negative result within the first hours of the onset of symptoms does not rule out myocardial infarction with certainty. If myocardial infarction is still suspected, repeat the test at appropriate intervals.   Lipid panel     Status: Abnormal   Collection Time: 04/19/16 10:22 AM  Result Value Ref Range   Cholesterol 239 (H) 0 - 200 mg/dL    Comment: ATP III Classification       Desirable:  < 200 mg/dL               Borderline High:  200 - 239 mg/dL          High:  > = 240 mg/dL   Triglycerides 111.0 0.0 - 149.0 mg/dL    Comment: Normal:  <150 mg/dLBorderline High:  150 - 199 mg/dL   HDL 57.00 >39.00 mg/dL   VLDL 22.2 0.0 - 40.0 mg/dL   LDL Cholesterol 160 (H) 0 - 99 mg/dL   Total CHOL/HDL Ratio 4     Comment:                Men          Women1/2 Average Risk     3.4          3.3Average Risk          5.0          4.42X Average Risk          9.6          7.13X Average Risk          15.0          11.0                       NonHDL 182.30     Comment: NOTE:  Non-HDL goal should be 30 mg/dL higher than patient's LDL goal (i.e. LDL goal of < 70 mg/dL, would have non-HDL goal of < 100 mg/dL)  Hemoglobin A1c     Status: None   Collection Time: 04/19/16 10:22 AM  Result Value Ref Range   Hgb A1c MFr Bld 5.8 4.6 - 6.5 %    Comment: Glycemic Control Guidelines for People with Diabetes:Non Diabetic:  <6%Goal of Therapy: <7%Additional Action Suggested:  >8%    Follow up: Return in about 6 months (around 10/18/2016) for HTN , hyperlipidemia.  Wilfred Lacy, NP

## 2016-04-20 ENCOUNTER — Encounter: Payer: Self-pay | Admitting: Nurse Practitioner

## 2016-04-20 MED ORDER — ATORVASTATIN CALCIUM 20 MG PO TABS
20.0000 mg | ORAL_TABLET | Freq: Every day | ORAL | 5 refills | Status: DC
Start: 2016-04-20 — End: 2016-06-15

## 2016-04-22 ENCOUNTER — Other Ambulatory Visit: Payer: Commercial Managed Care - HMO

## 2016-04-22 DIAGNOSIS — Z0001 Encounter for general adult medical examination with abnormal findings: Secondary | ICD-10-CM | POA: Diagnosis not present

## 2016-04-22 LAB — HIV ANTIBODY (ROUTINE TESTING W REFLEX): HIV: NONREACTIVE

## 2016-04-22 LAB — HEPATITIS C ANTIBODY: HCV AB: NEGATIVE

## 2016-04-26 ENCOUNTER — Other Ambulatory Visit: Payer: Self-pay | Admitting: Nurse Practitioner

## 2016-04-26 DIAGNOSIS — N63 Unspecified lump in unspecified breast: Secondary | ICD-10-CM

## 2016-04-26 NOTE — Progress Notes (Signed)
Normal results, see office note

## 2016-04-27 ENCOUNTER — Encounter: Payer: Self-pay | Admitting: Internal Medicine

## 2016-05-05 ENCOUNTER — Other Ambulatory Visit: Payer: Commercial Managed Care - HMO

## 2016-05-06 ENCOUNTER — Ambulatory Visit
Admission: RE | Admit: 2016-05-06 | Discharge: 2016-05-06 | Disposition: A | Discharge status: Home / Self Care | Payer: Commercial Managed Care - HMO | Source: Ambulatory Visit | Attending: Nurse Practitioner | Admitting: Nurse Practitioner

## 2016-05-06 DIAGNOSIS — N63 Unspecified lump in unspecified breast: Secondary | ICD-10-CM

## 2016-05-06 DIAGNOSIS — R928 Other abnormal and inconclusive findings on diagnostic imaging of breast: Secondary | ICD-10-CM | POA: Diagnosis not present

## 2016-05-06 DIAGNOSIS — Z803 Family history of malignant neoplasm of breast: Secondary | ICD-10-CM

## 2016-05-09 HISTORY — PX: COLONOSCOPY W/ POLYPECTOMY: SHX1380

## 2016-05-25 ENCOUNTER — Ambulatory Visit: Payer: Commercial Managed Care - HMO | Admitting: Family

## 2016-06-15 ENCOUNTER — Ambulatory Visit (AMBULATORY_SURGERY_CENTER): Payer: Self-pay

## 2016-06-15 ENCOUNTER — Encounter: Payer: Self-pay | Admitting: Internal Medicine

## 2016-06-15 VITALS — Ht 63.5 in | Wt 190.0 lb

## 2016-06-15 DIAGNOSIS — Z1211 Encounter for screening for malignant neoplasm of colon: Secondary | ICD-10-CM

## 2016-06-15 NOTE — Progress Notes (Signed)
Patient denies allergies to eggs or soy. Patient is not on diet pills. Patient is not on 02 at home. Patient declined emmi.  Patient denies problems with anesthesia.

## 2016-06-29 ENCOUNTER — Ambulatory Visit (AMBULATORY_SURGERY_CENTER): Payer: Medicare HMO | Admitting: Internal Medicine

## 2016-06-29 ENCOUNTER — Encounter: Payer: Self-pay | Admitting: Internal Medicine

## 2016-06-29 VITALS — BP 128/83 | HR 67 | Temp 98.0°F | Resp 13 | Ht 63.5 in | Wt 190.0 lb

## 2016-06-29 DIAGNOSIS — Z1211 Encounter for screening for malignant neoplasm of colon: Secondary | ICD-10-CM

## 2016-06-29 DIAGNOSIS — F329 Major depressive disorder, single episode, unspecified: Secondary | ICD-10-CM | POA: Diagnosis not present

## 2016-06-29 DIAGNOSIS — D128 Benign neoplasm of rectum: Secondary | ICD-10-CM | POA: Diagnosis not present

## 2016-06-29 DIAGNOSIS — Z1212 Encounter for screening for malignant neoplasm of rectum: Secondary | ICD-10-CM | POA: Diagnosis not present

## 2016-06-29 DIAGNOSIS — K635 Polyp of colon: Secondary | ICD-10-CM | POA: Diagnosis not present

## 2016-06-29 DIAGNOSIS — I10 Essential (primary) hypertension: Secondary | ICD-10-CM | POA: Diagnosis not present

## 2016-06-29 MED ORDER — SODIUM CHLORIDE 0.9 % IV SOLN: 500.0000 mL | INTRAVENOUS | Status: DC

## 2016-06-29 NOTE — Progress Notes (Signed)
Called to room to assist during endoscopic procedure.  Patient ID and intended procedure confirmed with present staff. Received instructions for my participation in the procedure from the performing physician.  

## 2016-06-29 NOTE — Patient Instructions (Addendum)
I found and removed one small polyp.  I will let you know pathology results and when to have another routine colonoscopy by mail.  I appreciate the opportunity to care for you. Gatha Mayer, MD, FACG   YOU HAD AN ENDOSCOPIC PROCEDURE TODAY AT [facility]:   Refer to the procedure report that was given to you for any specific questions about what was found during the examination.  If the procedure report does not answer your questions, please call your gastroenterologist to clarify.  If you requested that your care partner not be given the details of your procedure findings, then the procedure report has been included in a sealed envelope for you to review at your convenience later.  YOU SHOULD EXPECT: Some feelings of bloating in the abdomen. Passage of more gas than usual.  Walking can help get rid of the air that was put into your GI tract during the procedure and reduce the bloating. If you had a lower endoscopy (such as a colonoscopy or flexible sigmoidoscopy) you may notice spotting of blood in your stool or on the toilet paper. If you underwent a bowel prep for your procedure, you may not have a normal bowel movement for a few days.  Please Note:  You might notice some irritation and congestion in your nose or some drainage.  This is from the oxygen used during your procedure.  There is no need for concern and it should clear up in a day or so.  SYMPTOMS TO REPORT IMMEDIATELY:   Following lower endoscopy (colonoscopy or flexible sigmoidoscopy):  Excessive amounts of blood in the stool  Significant tenderness or worsening of abdominal pains  Swelling of the abdomen that is new, acute  Fever of 100F or higher  For urgent or emergent issues, a gastroenterologist can be reached at any hour by calling [phone].   DIET:  We do recommend a small meal at first, but then you may proceed to your regular diet.  Drink plenty of fluids but you should avoid  alcoholic beverages for 24 hours.  ACTIVITY:  You should plan to take it easy for the rest of today and you should NOT DRIVE or use heavy machinery until tomorrow (because of the sedation medicines used during the test).    FOLLOW UP: Our staff will call the number listed on your records the next business day following your procedure to check on you and address any questions or concerns that you may have regarding the information given to you following your procedure. If we do not reach you, we will leave a message.  However, if you are feeling well and you are not experiencing any problems, there is no need to return our call.  We will assume that you have returned to your regular daily activities without incident.  If any biopsies were taken you will be contacted by phone or by letter within the next 1-3 weeks.  Please call us at [phone] if you have not heard about the biopsies in 3 weeks.    SIGNATURES/CONFIDENTIALITY: You and/or your care partner have signed paperwork which will be entered into your electronic medical record.  These signatures attest to the fact that that the information above on your After Visit Summary has been reviewed and is understood.  Full responsibility of the confidentiality of this discharge information lies with you and/or your care-partner.    Handouts were given to your care partner on polyps. You may resume  your current medications today. Await biopsy results. Please call if any questions or concerns.

## 2016-06-29 NOTE — Progress Notes (Signed)
No problems noted in the recovery room. maw 

## 2016-06-29 NOTE — Op Note (Signed)
Amy Vincent Patient Name: Amy Vincent Procedure Date: 06/29/2016 3:50 PM MRN: [id number] Endoscopist: Gatha Mayer , MD Age: 61 Referring MD:  Date of Birth: 1956/03/24 Gender: Female Account #: [id number] Procedure:                Colonoscopy Indications:              Screening for colorectal malignant neoplasm, This                            is the patient's first colonoscopy Medicines:                Propofol per Anesthesia, Monitored Anesthesia Care Procedure:                Pre-Anesthesia Assessment:                           - Prior to the procedure, a History and Physical                            was performed, and patient medications and                            allergies were reviewed. The patient's tolerance of                            previous anesthesia was also reviewed. The risks                            and benefits of the procedure and the sedation                            options and risks were discussed with the patient.                            All questions were answered, and informed consent                            was obtained. Prior Anticoagulants: The patient has                            taken no previous anticoagulant or antiplatelet                            agents. ASA Grade Assessment: II - A patient with                            mild systemic disease. After reviewing the risks                            and benefits, the patient was deemed in                            satisfactory condition to undergo the procedure.  After obtaining informed consent, the colonoscope                            was passed under direct vision. Throughout the                            procedure, the patient's blood pressure, pulse, and                            oxygen saturations were monitored continuously. The                            Model CF-HQ190L [id number]) scope was introduced                             through the anus and advanced to the the cecum,                            identified by appendiceal orifice and ileocecal                            valve. The colonoscopy was performed without                            difficulty. The patient tolerated the procedure                            well. The quality of the bowel preparation was                            good. The bowel preparation used was Miralax. The                            ileocecal valve, appendiceal orifice, and rectum                            were photographed. Scope In: 4:00:04 PM Scope Out: 4:10:08 PM Scope Withdrawal Time: 0 hours 8 minutes 21 seconds  Total Procedure Duration: 0 hours 10 minutes 4 seconds  Findings:                 The perianal and digital rectal examinations were                            normal.                           A diminutive polyp was found in the rectum. The                            polyp was sessile. The polyp was removed with a                            cold snare. Resection and retrieval were complete.  Verification of patient identification for the                            specimen was done. Estimated blood loss was minimal.                           The exam was otherwise without abnormality on                            direct and retroflexion views. Complications:            No immediate complications. Estimated Blood Loss:     Estimated blood loss was minimal. Impression:               - One diminutive polyp in the rectum, removed with                            a cold snare. Resected and retrieved.                           - The examination was otherwise normal on direct                            and retroflexion views. Recommendation:           - Patient has a contact number available for                            emergencies. The signs and symptoms of potential                            delayed complications were discussed with the                             patient. Return to normal activities tomorrow.                            Written discharge instructions were provided to the                            patient.                           - Resume previous diet.                           - Continue present medications.                           - Repeat colonoscopy is recommended. The                            colonoscopy date will be determined after pathology                            results from today's exam become available for  review. Gatha Mayer, MD 06/29/2016 4:19:47 PM This report has been signed electronically.

## 2016-06-30 ENCOUNTER — Telehealth: Payer: Self-pay | Admitting: *Deleted

## 2016-06-30 NOTE — Telephone Encounter (Signed)
  Follow up Call-  Call back number 06/29/2016  Post procedure Call Back phone  # [phone]  Permission to leave phone message Yes  Some recent data might be hidden     Patient questions:  Do you have a fever, pain , or abdominal swelling? No. Pain Score  0 *  Have you tolerated food without any problems? Yes.    Have you been able to return to your normal activities? Yes.    Do you have any questions about your discharge instructions: Diet   No. Medications  No. Follow up visit  No.  Do you have questions or concerns about your Care? No.  Actions: * If pain score is 4 or above: No action needed, pain <4.

## 2016-07-07 ENCOUNTER — Encounter: Payer: Self-pay | Admitting: Internal Medicine

## 2016-07-07 DIAGNOSIS — Z86010 Personal history of colonic polyps: Secondary | ICD-10-CM

## 2016-07-07 DIAGNOSIS — Z860101 Personal history of adenomatous and serrated colon polyps: Secondary | ICD-10-CM

## 2016-07-07 HISTORY — DX: Personal history of colonic polyps: Z86.010

## 2016-07-07 HISTORY — DX: Personal history of adenomatous and serrated colon polyps: Z86.0101

## 2016-07-07 NOTE — Progress Notes (Signed)
Diminutive rectal adenoma - recall 5 years 2023 My Chart letter

## 2016-07-13 ENCOUNTER — Encounter: Payer: Self-pay | Admitting: Nurse Practitioner

## 2016-08-18 ENCOUNTER — Other Ambulatory Visit: Payer: Self-pay | Admitting: Family Medicine

## 2016-08-18 ENCOUNTER — Ambulatory Visit
Admission: RE | Admit: 2016-08-18 | Discharge: 2016-08-18 | Disposition: A | Discharge status: Home / Self Care | Payer: Medicare HMO | Source: Ambulatory Visit | Attending: Family Medicine | Admitting: Family Medicine

## 2016-08-18 DIAGNOSIS — M130 Polyarthritis, unspecified: Secondary | ICD-10-CM | POA: Diagnosis not present

## 2016-08-18 DIAGNOSIS — M25469 Effusion, unspecified knee: Secondary | ICD-10-CM

## 2016-08-18 DIAGNOSIS — I10 Essential (primary) hypertension: Secondary | ICD-10-CM | POA: Diagnosis not present

## 2016-08-18 DIAGNOSIS — R609 Edema, unspecified: Secondary | ICD-10-CM

## 2016-08-18 DIAGNOSIS — R945 Abnormal results of liver function studies: Secondary | ICD-10-CM | POA: Diagnosis not present

## 2016-08-18 DIAGNOSIS — M1712 Unilateral primary osteoarthritis, left knee: Secondary | ICD-10-CM | POA: Diagnosis not present

## 2016-08-25 DIAGNOSIS — Z0000 Encounter for general adult medical examination without abnormal findings: Secondary | ICD-10-CM | POA: Diagnosis not present

## 2016-08-25 DIAGNOSIS — E785 Hyperlipidemia, unspecified: Secondary | ICD-10-CM | POA: Diagnosis not present

## 2016-08-26 DIAGNOSIS — M1712 Unilateral primary osteoarthritis, left knee: Secondary | ICD-10-CM | POA: Diagnosis not present

## 2016-10-13 ENCOUNTER — Other Ambulatory Visit: Payer: Self-pay | Admitting: Family Medicine

## 2016-10-13 ENCOUNTER — Other Ambulatory Visit (HOSPITAL_COMMUNITY)
Admission: RE | Admit: 2016-10-13 | Discharge: 2016-10-13 | Disposition: A | Discharge status: Home / Self Care | Payer: Medicare HMO | Source: Ambulatory Visit | Attending: Family Medicine | Admitting: Family Medicine

## 2016-10-13 DIAGNOSIS — I10 Essential (primary) hypertension: Secondary | ICD-10-CM | POA: Diagnosis not present

## 2016-10-13 DIAGNOSIS — N762 Acute vulvitis: Secondary | ICD-10-CM | POA: Diagnosis not present

## 2016-10-13 DIAGNOSIS — Z113 Encounter for screening for infections with a predominantly sexual mode of transmission: Secondary | ICD-10-CM | POA: Diagnosis not present

## 2016-10-17 LAB — URINE CYTOLOGY ANCILLARY ONLY
BACTERIAL VAGINITIS: POSITIVE — AB
CANDIDA VAGINITIS: NEGATIVE
CHLAMYDIA, DNA PROBE: NEGATIVE
NEISSERIA GONORRHEA: NEGATIVE
TRICH (WINDOWPATH): NEGATIVE

## 2016-12-15 DIAGNOSIS — E785 Hyperlipidemia, unspecified: Secondary | ICD-10-CM | POA: Diagnosis not present

## 2016-12-15 DIAGNOSIS — M130 Polyarthritis, unspecified: Secondary | ICD-10-CM | POA: Diagnosis not present

## 2016-12-15 DIAGNOSIS — I10 Essential (primary) hypertension: Secondary | ICD-10-CM | POA: Diagnosis not present

## 2017-02-12 ENCOUNTER — Emergency Department (HOSPITAL_COMMUNITY)
Admission: EM | Admit: 2017-02-12 | Discharge: 2017-02-12 | Disposition: A | Discharge status: Home / Self Care | Payer: Medicare HMO | Attending: Emergency Medicine | Admitting: Emergency Medicine

## 2017-02-12 ENCOUNTER — Emergency Department (HOSPITAL_COMMUNITY): Payer: Medicare HMO

## 2017-02-12 ENCOUNTER — Encounter (HOSPITAL_COMMUNITY): Payer: Self-pay | Admitting: *Deleted

## 2017-02-12 DIAGNOSIS — M25462 Effusion, left knee: Secondary | ICD-10-CM | POA: Diagnosis not present

## 2017-02-12 DIAGNOSIS — Z79899 Other long term (current) drug therapy: Secondary | ICD-10-CM | POA: Diagnosis not present

## 2017-02-12 DIAGNOSIS — M25562 Pain in left knee: Secondary | ICD-10-CM | POA: Diagnosis present

## 2017-02-12 DIAGNOSIS — Z87891 Personal history of nicotine dependence: Secondary | ICD-10-CM | POA: Diagnosis not present

## 2017-02-12 DIAGNOSIS — I10 Essential (primary) hypertension: Secondary | ICD-10-CM | POA: Diagnosis not present

## 2017-02-12 MED ORDER — PREDNISONE 10 MG PO TABS: ORAL_TABLET | ORAL | 0 refills | Status: DC

## 2017-02-12 MED ORDER — MELOXICAM 7.5 MG PO TABS: 7.5000 mg | ORAL_TABLET | Freq: Every day | ORAL | 0 refills | Status: DC

## 2017-02-12 NOTE — ED Notes (Signed)
Patient offered wheelchair. Declined. Stated that she wanted to "walk out to exercise my knee."

## 2017-02-12 NOTE — ED Notes (Signed)
See EDP secondary assessment.  

## 2017-02-12 NOTE — ED Provider Notes (Signed)
[facility] DEPT Provider Note   CSN: [id number] Arrival date & time: 02/12/17  0714     History   Chief Complaint Chief Complaint  Patient presents with  . Knee Pain    HPI Amy Vincent is a 61 y.o. female.  The history is provided by the patient. No language interpreter was used.  Knee Pain   This is a new problem. The problem occurs constantly. The problem has been gradually worsening. The pain is present in the left knee. The quality of the pain is described as aching. The pain is moderate. Associated symptoms include limited range of motion. She has tried nothing for the symptoms. The treatment provided no relief. There has been no history of extremity trauma.  Pt has a history of knee pain.  Pt has been seen at [facility] and has had cortisone injections without relief.    Past Medical History:  Diagnosis Date  . High cholesterol   . Hx of adenomatous polyp of rectum 07/07/2016  . Hypertension     Patient Active Problem List   Diagnosis Date Noted  . Hx of adenomatous polyp of rectum 07/07/2016  . Breast mass seen on mammogram 04/19/2016  . FH: breast cancer in first degree relative when <30 years old 04/19/2016  . Eustachian tube dysfunction, bilateral 04/19/2016  . Hyperglycemia 04/19/2016  . Depression 04/24/2015  . Chronic low back pain 03/25/2015  . HTN (hypertension) 03/25/2015  . Hyperlipidemia 03/25/2015    Past Surgical History:  Procedure Laterality Date  . ABDOMINAL HYSTERECTOMY  1999  . COLONOSCOPY  2006   Dr. Mann/ Normal Colonoscopy  . INCISION AND DRAINAGE     I & D of an abscess on back  . left arm surgery  2004   orthopedic surgery left arm due to fracture as a child that healed improperly    OB History    No data available       Home Medications    Prior to Admission medications   Medication Sig Start Date End Date Taking? Authorizing Provider  atorvastatin (LIPITOR) 20 MG tablet Take 20 mg by mouth daily.    [provider]  diltiazem (TIAZAC) 240 MG 24 hr capsule Take 240 mg by mouth daily.    [provider]  HYDROcodone-acetaminophen (NORCO/VICODIN) 5-325 MG tablet Take 1 tablet by mouth every 6 (six) hours as needed for moderate pain.    [provider]  losartan-hydrochlorothiazide (HYZAAR) 100-25 MG tablet Take 1 tablet by mouth daily.    [provider]  meloxicam (MOBIC) 7.5 MG tablet Take 1 tablet (7.5 mg total) by mouth daily. 02/12/17   Fransico Meadow, PA-C  metoprolol tartrate (LOPRESSOR) 25 MG tablet Take 25 mg by mouth daily.    [provider]  predniSONE (DELTASONE) 10 MG tablet Four tablet a day for 4 days 02/12/17   Fransico Meadow, PA-C    Family History Family History  Problem Relation Age of Onset  . Cancer Mother 43       breast  . Breast cancer Mother   . Diabetes Father   . Cancer Maternal Aunt 69       breast  . Cancer Maternal Aunt        stomach  . Hypertension Paternal Uncle   . Cancer Paternal Uncle 40       colon cancer  . Colon polyps Neg Hx     Social History Social History  Substance Use Topics  .  Smoking status: Former Smoker    Quit date: 05/09/1994  . Smokeless tobacco: Never Used  . Alcohol use 3.0 oz/week    3 Standard drinks or equivalent, 2 Shots of liquor per week     Comment: 2 a week     Allergies   Patient has no known allergies.   Review of Systems Review of Systems  Musculoskeletal: Positive for joint swelling.  All other systems reviewed and are negative.    Physical Exam Updated Vital Signs BP 106/75 (BP Location: Left Arm)   Pulse 67   Temp 98.2 F (36.8 C) (Oral)   Resp 18   SpO2 99%   Physical Exam  Constitutional: She appears well-developed and well-nourished.  Musculoskeletal: She exhibits tenderness.  Swollen left knee,  Pain with range of motion,  Small effusion,   Neurological: She is alert.  Skin: Skin is warm.  Psychiatric: She has a normal mood and affect.  Nursing  note and vitals reviewed.    ED Treatments / Results  Labs (all labs ordered are listed, but only abnormal results are displayed) Labs Reviewed - No data to display  EKG  EKG Interpretation None       Radiology Dg Knee Complete 4 Views Left  Result Date: 02/12/2017 CLINICAL DATA:  Pt reports left knee pain and swelling starting last night after wearing stilettos Friday night. Pt c/o of medial knee pain EXAM: LEFT KNEE - COMPLETE 4+ VIEW COMPARISON:  08/18/2016 FINDINGS: No fracture.  No bone lesion. Knee joint is normally spaced and aligned. No significant degenerative/ arthropathic change. Small joint effusion. Soft tissues are unremarkable. IMPRESSION: 1. No fracture, bone lesion or significant knee joint arthropathic change. 2. Small joint effusion. Electronically Signed   By: Lajean Manes M.D.   On: 02/12/2017 07:52    Procedures Procedures (including critical care time)  Medications Ordered in ED Medications - No data to display   Initial Impression / Assessment and Plan / ED Course  I have reviewed the triage vital signs and the nursing notes.  Pertinent labs & imaging results that were available during my care of the patient were reviewed by me and considered in my medical decision making (see chart for details).       Final Clinical Impressions(s) / ED Diagnoses   Final diagnoses:  Effusion of left knee    New Prescriptions New Prescriptions   MELOXICAM (MOBIC) 7.5 MG TABLET    Take 1 tablet (7.5 mg total) by mouth daily.   PREDNISONE (DELTASONE) 10 MG TABLET    Four tablet a day for 4 days   An After Visit Summary was printed and given to the patient.    Fransico Meadow, [place] 02/12/17 [id number]    Noemi Chapel, MD 02/14/17 2022

## 2017-02-12 NOTE — ED Triage Notes (Signed)
Pt reports ongoing left knee pain and swelling. Denies new injury. Has hx of same and has gotten injections and fluid drawn from knee in past.

## 2017-02-12 NOTE — Discharge Instructions (Signed)
See the Orthopaedist for recheck
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
## 2017-04-20 ENCOUNTER — Encounter: Payer: Commercial Managed Care - HMO | Admitting: Nurse Practitioner

## 2017-08-10 DIAGNOSIS — Z91128 Patient's intentional underdosing of medication regimen for other reason: Secondary | ICD-10-CM | POA: Diagnosis not present

## 2017-08-10 DIAGNOSIS — I10 Essential (primary) hypertension: Secondary | ICD-10-CM | POA: Diagnosis not present

## 2017-08-10 DIAGNOSIS — E785 Hyperlipidemia, unspecified: Secondary | ICD-10-CM | POA: Diagnosis not present

## 2017-09-14 DIAGNOSIS — E782 Mixed hyperlipidemia: Secondary | ICD-10-CM | POA: Diagnosis not present

## 2017-09-14 DIAGNOSIS — I10 Essential (primary) hypertension: Secondary | ICD-10-CM | POA: Diagnosis not present

## 2017-09-28 DIAGNOSIS — Z0000 Encounter for general adult medical examination without abnormal findings: Secondary | ICD-10-CM | POA: Diagnosis not present

## 2017-10-26 DIAGNOSIS — E669 Obesity, unspecified: Secondary | ICD-10-CM | POA: Diagnosis not present

## 2017-10-26 DIAGNOSIS — I10 Essential (primary) hypertension: Secondary | ICD-10-CM | POA: Diagnosis not present

## 2018-01-01 DIAGNOSIS — Z6831 Body mass index (BMI) 31.0-31.9, adult: Secondary | ICD-10-CM | POA: Diagnosis not present

## 2018-01-01 DIAGNOSIS — E782 Mixed hyperlipidemia: Secondary | ICD-10-CM | POA: Diagnosis not present

## 2018-01-01 DIAGNOSIS — I10 Essential (primary) hypertension: Secondary | ICD-10-CM | POA: Diagnosis not present

## 2018-02-24 ENCOUNTER — Encounter (HOSPITAL_COMMUNITY): Payer: Self-pay

## 2018-02-24 ENCOUNTER — Other Ambulatory Visit: Payer: Self-pay

## 2018-02-24 ENCOUNTER — Emergency Department (HOSPITAL_COMMUNITY): Payer: Medicare HMO

## 2018-02-24 ENCOUNTER — Emergency Department (HOSPITAL_COMMUNITY)
Admission: EM | Admit: 2018-02-24 | Discharge: 2018-02-24 | Disposition: A | Discharge status: Home / Self Care | Payer: Medicare HMO | Attending: Emergency Medicine | Admitting: Emergency Medicine

## 2018-02-24 DIAGNOSIS — Z79899 Other long term (current) drug therapy: Secondary | ICD-10-CM | POA: Insufficient documentation

## 2018-02-24 DIAGNOSIS — I10 Essential (primary) hypertension: Secondary | ICD-10-CM | POA: Diagnosis not present

## 2018-02-24 DIAGNOSIS — M546 Pain in thoracic spine: Secondary | ICD-10-CM | POA: Insufficient documentation

## 2018-02-24 DIAGNOSIS — Z87891 Personal history of nicotine dependence: Secondary | ICD-10-CM | POA: Diagnosis not present

## 2018-02-24 DIAGNOSIS — R001 Bradycardia, unspecified: Secondary | ICD-10-CM | POA: Diagnosis not present

## 2018-02-24 DIAGNOSIS — M549 Dorsalgia, unspecified: Secondary | ICD-10-CM | POA: Diagnosis not present

## 2018-02-24 LAB — BASIC METABOLIC PANEL
ANION GAP: 8 (ref 5–15)
BUN: 15 mg/dL (ref 8–23)
CO2: 26 mmol/L (ref 22–32)
Calcium: 9.5 mg/dL (ref 8.9–10.3)
Chloride: 106 mmol/L (ref 98–111)
Creatinine, Ser: 0.85 mg/dL (ref 0.44–1.00)
GFR calc Af Amer: >60 mL/min (ref >60)
GFR calc non Af Amer: >60 mL/min (ref >60)
GLUCOSE: 91 mg/dL (ref 70–99)
POTASSIUM: 3.8 mmol/L (ref 3.5–5.1)
Sodium: 140 mmol/L (ref 135–145)

## 2018-02-24 LAB — URINALYSIS, ROUTINE W REFLEX MICROSCOPIC
BILIRUBIN URINE: NEGATIVE
GLUCOSE, UA: NEGATIVE mg/dL
HGB URINE DIPSTICK: NEGATIVE
KETONES UR: NEGATIVE mg/dL
NITRITE: NEGATIVE
PROTEIN: NEGATIVE mg/dL
Specific Gravity, Urine: 1.023 (ref 1.005–1.030)
pH: 6 (ref 5.0–8.0)

## 2018-02-24 LAB — CBC WITH DIFFERENTIAL/PLATELET
ABS IMMATURE GRANULOCYTES: 0.02 10*3/uL (ref 0.00–0.07)
Basophils Absolute: 0.1 10*3/uL (ref 0.0–0.1)
Basophils Relative: 1 %
EOS ABS: 0.8 10*3/uL — AB (ref 0.0–0.5)
Eosinophils Relative: 10 %
HEMATOCRIT: 46.5 % — AB (ref 36.0–46.0)
Hemoglobin: 15.3 g/dL — ABNORMAL HIGH (ref 12.0–15.0)
IMMATURE GRANULOCYTES: 0 %
LYMPHS ABS: 2.2 10*3/uL (ref 0.7–4.0)
Lymphocytes Relative: 30 %
MCH: 29.4 pg (ref 26.0–34.0)
MCHC: 32.9 g/dL (ref 30.0–36.0)
MCV: 89.4 fL (ref 80.0–100.0)
MONO ABS: 0.9 10*3/uL (ref 0.1–1.0)
MONOS PCT: 12 %
NEUTROS PCT: 47 %
Neutro Abs: 3.5 10*3/uL (ref 1.7–7.7)
Platelets: 257 10*3/uL (ref 150–400)
RBC: 5.2 MIL/uL — ABNORMAL HIGH (ref 3.87–5.11)
RDW: 12.8 % (ref 11.5–15.5)
WBC: 7.5 10*3/uL (ref 4.0–10.5)
nRBC: 0 % (ref 0.0–0.2)

## 2018-02-24 LAB — D-DIMER, QUANTITATIVE (NOT AT ARMC)

## 2018-02-24 LAB — I-STAT TROPONIN, ED: Troponin i, poc: 0 ng/mL (ref 0.00–0.08)

## 2018-02-24 MED ORDER — OXYCODONE-ACETAMINOPHEN 5-325 MG PO TABS
1.0000 | ORAL_TABLET | Freq: Once | ORAL | Status: AC
  Administered 2018-02-24: 1 via ORAL
  Filled 2018-02-24: qty 1

## 2018-02-24 MED ORDER — METHOCARBAMOL 500 MG PO TABS: 500.0000 mg | ORAL_TABLET | Freq: Two times a day (BID) | ORAL | 0 refills | Status: DC

## 2018-02-24 MED ORDER — NAPROXEN 375 MG PO TABS: 375.0000 mg | ORAL_TABLET | Freq: Two times a day (BID) | ORAL | 0 refills | Status: DC

## 2018-02-24 NOTE — ED Notes (Signed)
Pt discharged from ED; instructions provided and scripts given; Pt encouraged to return to ED if symptoms worsen and to f/u with PCP; Pt verbalized understanding of all instructions 

## 2018-02-24 NOTE — ED Provider Notes (Signed)
[facility] EMERGENCY DEPARTMENT Provider Note   CSN: [id number] Arrival date & time: 02/24/18  0537     History   Chief Complaint Chief Complaint  Patient presents with  . Back Pain    HPI Amy Vincent is a 62 y.o. female.  HPI  Patient is a 62 year old female with a history of hypertension and hypercholesterolemia presenting for thoracic back pain.  Patient reports that has been occurring for the past 24 hours.  Patient reports that the pain is constant, and is equally painful with both movement and rest.  Patient reports that it is sharp and crampy in nature, similar to her prior back pain, however it is slightly higher than she typically experiences pain.  Patient denies recent heavy lifting, but does report that the day before the onset of her pain, she was extensively cleaning the bathroom and leaning over the tub.  Patient denies any weakness or numbness in upper lower extremities, saddle anesthesia, loss of our bladder control, urinary retention with overflow incontinence.  Patient denies any history of cancer, IVDU.  Patient denies any recent prolonged immobilization, hospitalization, hormone use, history DVT/PE, recent cancer treatment, lower extremity swelling, coughing, shortness of breath, hemoptysis or chest pain.  No fever or chills.  Patient denies taking remedies at home for symptom control.  Past Medical History:  Diagnosis Date  . High cholesterol   . Hx of adenomatous polyp of rectum 07/07/2016  . Hypertension     Patient Active Problem List   Diagnosis Date Noted  . Hx of adenomatous polyp of rectum 07/07/2016  . Breast mass seen on mammogram 04/19/2016  . FH: breast cancer in first degree relative when <62 years old 04/19/2016  . Eustachian tube dysfunction, bilateral 04/19/2016  . Hyperglycemia 04/19/2016  . Depression 04/24/2015  . Chronic low back pain 03/25/2015  . HTN (hypertension) 03/25/2015  . Hyperlipidemia 03/25/2015     Past Surgical History:  Procedure Laterality Date  . ABDOMINAL HYSTERECTOMY  1999  . COLONOSCOPY  2006   Dr. Mann/ Normal Colonoscopy  . INCISION AND DRAINAGE     I & D of an abscess on back  . left arm surgery  2004   orthopedic surgery left arm due to fracture as a child that healed improperly     OB History   None      Home Medications    Prior to Admission medications   Medication Sig Start Date End Date Taking? Authorizing Provider  atorvastatin (LIPITOR) 20 MG tablet Take 20 mg by mouth daily.    [provider]  diltiazem (TIAZAC) 240 MG 24 hr capsule Take 240 mg by mouth daily.    [provider]  HYDROcodone-acetaminophen (NORCO/VICODIN) 5-325 MG tablet Take 1 tablet by mouth every 6 (six) hours as needed for moderate pain.    [provider]  losartan-hydrochlorothiazide (HYZAAR) 100-25 MG tablet Take 1 tablet by mouth daily.    [provider]  meloxicam (MOBIC) 7.5 MG tablet Take 1 tablet (7.5 mg total) by mouth daily. 02/12/17   Fransico Meadow, PA-C  metoprolol tartrate (LOPRESSOR) 25 MG tablet Take 25 mg by mouth daily.    [provider]  predniSONE (DELTASONE) 10 MG tablet Four tablet a day for 4 days 02/12/17   Fransico Meadow, PA-C    Family History Family History  Problem Relation Age of Onset  . Cancer Mother 9       breast  . Breast cancer Mother   .  Diabetes Father   . Cancer Maternal Aunt 69       breast  . Cancer Maternal Aunt        stomach  . Hypertension Paternal Uncle   . Cancer Paternal Uncle 55       colon cancer  . Colon polyps Neg Hx     Social History Social History   Tobacco Use  . Smoking status: Former Smoker    Last attempt to quit: 05/09/1994    Years since quitting: 23.8  . Smokeless tobacco: Never Used  Substance Use Topics  . Alcohol use: Yes    Alcohol/week: 5.0 standard drinks    Types: 3 Standard drinks or equivalent, 2 Shots of liquor per week    Comment: 2 a week   . Drug use: No     Allergies   Patient has no known allergies.   Review of Systems Review of Systems  Constitutional: Negative for chills and fever.  HENT: Negative for congestion, rhinorrhea, sinus pain and sore throat.   Respiratory: Negative for cough, chest tightness and shortness of breath.   Cardiovascular: Negative for chest pain and leg swelling.  Gastrointestinal: Negative for abdominal pain, nausea and vomiting.  Genitourinary: Negative for dysuria and flank pain.  Musculoskeletal: Positive for arthralgias and back pain. Negative for gait problem and myalgias.  Skin: Negative for rash.  Neurological: Negative for weakness and numbness.  All other systems reviewed and are negative.    Physical Exam Updated Vital Signs BP (!) 138/91 (BP Location: Right Arm)   Pulse 75   Temp 97.8 F (36.6 C) (Oral)   Resp 18   SpO2 98%   Physical Exam  Constitutional: She appears well-developed and well-nourished.  Lying flat on the bed appearing uncomfortable with movement.  HENT:  Head: Normocephalic and atraumatic.  Mouth/Throat: Oropharynx is clear and moist.  Eyes: Pupils are equal, round, and reactive to light. Conjunctivae and EOM are normal.  Neck: Normal range of motion. Neck supple.  Cardiovascular: Normal rate, regular rhythm, S1 normal and S2 normal.  No murmur heard. Pulses:      Radial pulses are 2+ on the right side, and 2+ on the left side.       Dorsalis pedis pulses are 2+ on the right side, and 2+ on the left side.  No lower extremity edema.  No calf tenderness.  Pulmonary/Chest: Effort normal and breath sounds normal. She has no wheezes. She has no rales.  Abdominal: Soft. She exhibits no distension. There is no tenderness. There is no guarding.  Musculoskeletal: Normal range of motion. She exhibits no edema or deformity.  Spine Exam: Inspection/Palpation: TTP of the thoracic spine in a bandlike pattern. Strength: Strength 5 out of 5 with grip strength  in upper extremities.  5/5 throughout LE bilaterally (hip flexion/extension, adduction/abduction; knee flexion/extension; foot dorsiflexion/plantarflexion, inversion/eversion; great toe inversion) Sensation: Intact to light touch in proximal and distal LE bilaterally Reflexes: 2+ quadriceps and achilles reflexes Normal and symmetric gait.   Neurological: She is alert.  Cranial nerves grossly intact. Patient moves extremities symmetrically and with good coordination.  Skin: Skin is warm and dry. No rash noted. No erythema.  Psychiatric: She has a normal mood and affect. Her behavior is normal. Judgment and thought content normal.  Nursing note and vitals reviewed.    ED Treatments / Results  Labs (all labs ordered are listed, but only abnormal results are displayed) Labs Reviewed  D-DIMER, QUANTITATIVE (NOT AT [facility])  CBC WITH DIFFERENTIAL/PLATELET  BASIC METABOLIC PANEL  URINALYSIS, ROUTINE W REFLEX MICROSCOPIC  I-STAT TROPONIN, ED    EKG None  Radiology No results found.  Procedures Procedures (including critical care time)  Medications Ordered in ED Medications  oxyCODONE-acetaminophen (PERCOCET/ROXICET) 5-325 MG per tablet 1 tablet (has no administration in time range)     Initial Impression / Assessment and Plan / ED Course  I have reviewed the triage vital signs and the nursing notes.  Pertinent labs & imaging results that were available during my care of the patient were reviewed by me and considered in my medical decision making (see chart for details).  Clinical Course as of Feb 25 1136  Sat Feb 24, 2018  0851 Do not feel this is reflective of infection given contamination with squamous epithelials.  Urinalysis, Routine w reflex microscopic(!) [AM]  [id number] No evidence of compression fracture. Pt resting comfortably after analgesia.  DG Thoracic Spine 2 View [AM]  [id number] Reassessed.  No active pain.   [AM]  0930 Patient verbally verified a safe ride from the ED.  Proceeded with prescribing Percocet for pain/relaxtion/muscle relaxation in the ED.   [AM]    Clinical Course User Index [AM] Albesa Seen, PA-C    Patient is nontoxic-appearing, afebrile, hemodynamically stable, but uncomfortable.  Differential diagnosis includes pulmonary embolism, MI, musculoskeletal strain, compression fracture.  Doubt thoracic aortic dissection given patient's extended time course of symptoms without clinical deterioration, and equal pulses in all extremities.  Patient's work-up is reassuring.  EKG without acute ischemic findings, and consistent with prior.  Troponin negative.  D-dimer is negative.  Patient does have polycythemia, however this is consistent over multiple years, see below for details.  Patient has chronic bronchitic changes consistent with prior chest x-ray, but no acute cardiopulmonary findings on chest x-ray, and no evidence of compression fracture on thoracic spine radiographs.  Patient had market improvement after analgesia.  Hemoglobin  Date Value Ref Range Status  02/24/2018 15.3 (H) 12.0 - 15.0 g/dL Final  03/01/2016 15.6 (H) 12.0 - 15.0 g/dL Final  05/08/2015 15.0 12.0 - 15.0 g/dL Final  05/01/2015 16.0 (H) 12.0 - 15.0 g/dL Final   Muscle relaxants and NSAIDs dispensed.  Patient instructed not to drive, drink alcohol or operate machinery while taking muscle relaxants.  Patient without contraindications to NSAIDs.  Return precautions were given for any fever or chills with back pain, worsening pain, or neurologic changes in upper lower extremities.  Patient is in understanding and agrees with the plan of care.  This is a supervised visit with Dr. Delora Fuel. Evaluation, management, and discharge planning discussed with this attending physician.  Final Clinical Impressions(s) / ED Diagnoses   Final diagnoses:  Acute bilateral thoracic back pain  Elevated blood pressure reading with diagnosis of hypertension    ED Discharge Orders          Ordered    methocarbamol (ROBAXIN) 500 MG tablet  2 times daily     02/24/18 0927    naproxen (NAPROSYN) 375 MG tablet  2 times daily     02/24/18 0927           Albesa Seen, PA-C 02/58/52 7782    Delora Fuel, MD 42/35/36 2237

## 2018-02-24 NOTE — ED Triage Notes (Signed)
Pt here for lower back pain. Pain started up yesterday, hx of low back pain but states this is more the upper and thoracic region of the back. Ambulatory to triage

## 2018-02-24 NOTE — Discharge Instructions (Addendum)
Please see the information and instructions below regarding your visit.  Your diagnoses today include:  1. Acute bilateral thoracic back pain   2. Elevated blood pressure reading with diagnosis of hypertension    Most episodes of acute back pain are self-limited. Your exam was reassuring today that the source of your pain is not affecting the spinal cord and nerves that originate in the spinal cord.  Your heart and lung work-up was also reassuring as this was not the cause of your back pain.  If you have a history of disc herniation or arthritis in your spine, the nerves exiting the spine on one side get inflamed. This can cause severe pain. We call this radiculopathy. We do not always know what causes the sudden inflammation.  Tests performed today include: See side panel of your discharge paperwork for testing performed today. Vital signs are listed at the bottom of these instructions.   Medications prescribed:    Take any prescribed medications only as prescribed, and any over the counter medications only as directed on the packaging.  You are prescribed naproxen, a non-steroidal anti-inflammatory agent (NSAID) for pain. You may take 375 mg every 12 hours as needed for pain. If still requiring this medication around the clock for acute pain after 10 days, please see your primary healthcare provider.  Women who are pregnant, breastfeeding, or planning on becoming pregnant should not take non-steroidal anti-inflammatories such as Advil and Aleve. Tylenol is a safe over the counter pain reliever in pregnant women.  You may combine this medication with Tylenol, 650 mg every 6 hours, so you are receiving something for pain every 3 hours.  This is not a long-term medication unless under the care and direction of your primary provider. Taking this medication long-term and not under the supervision of a healthcare provider could increase the risk of stomach ulcers, kidney problems, and  cardiovascular problems such as high blood pressure.   You are prescribed Robaxin, a muscle relaxant. Some common side effects of this medication include:  Feeling sleepy.  Dizziness. Take care upon going from a seated to a standing position.  Dry mouth.  Feeling tired or weak.  Hard stools (constipation).  Upset stomach. These are not all of the side effects that may occur. If you have questions about side effects, call your doctor. Call your primary care provider for medical advice about side effects.  This medication can be sedating. Only take this medication as needed. Please do not combine with alcohol. Do not drive or operate machinery while taking this medication.   This medication can interact with some other medications. Make sure to tell any provider you are taking this medication before they prescribe you a new medication.   Please also apply Salon PAS patches to the back. These are topical over the counter remedies that can be found at any pharmacy.  Home care instructions:   Low back pain gets worse the longer you stay stationary. Please keep moving and walking as tolerated. There are exercises included in this packet to perform as tolerated for your low back pain.   Apply heat to the areas that are painful. Avoid twisting or bending your trunk to lift something. Do not lift anything above 25 lbs while recovering from this flare of low back pain.  Please follow any educational materials contained in this packet.   Follow-up instructions: Please follow-up with your primary care provider in 7-10 days for further evaluation of your symptoms if they are not  completely improved.   Return instructions:  Please return to the Emergency Department if you experience worsening symptoms.  Please return for any fever or chills in the setting of your back pain, weakness in the muscles of the legs, numbness in your legs and feet that is new or changing, numbness in the area where you  wipe, retention of your urine, loss of bowel or bladder control, or problems with walking. Please return if you have any other emergent concerns.  Additional Information:   Your vital signs today were: BP (!) 138/91 (BP Location: Right Arm)    Pulse 75    Temp 97.8 F (36.6 C) (Oral)    Resp 18    SpO2 98%  If your blood pressure (BP) was elevated on multiple readings during this visit above 130 for the top number or above 80 for the bottom number, please have this repeated by your primary care provider within one month. --------------  Thank you for allowing [place] to participate in your care today.

## 2018-06-01 ENCOUNTER — Other Ambulatory Visit: Payer: Self-pay | Admitting: Family Medicine

## 2018-06-01 ENCOUNTER — Other Ambulatory Visit (HOSPITAL_COMMUNITY)
Admission: RE | Admit: 2018-06-01 | Discharge: 2018-06-01 | Disposition: A | Discharge status: Home / Self Care | Payer: Medicare HMO | Source: Ambulatory Visit | Attending: Family Medicine | Admitting: Family Medicine

## 2018-06-01 DIAGNOSIS — E669 Obesity, unspecified: Secondary | ICD-10-CM | POA: Diagnosis not present

## 2018-06-01 DIAGNOSIS — I10 Essential (primary) hypertension: Secondary | ICD-10-CM | POA: Diagnosis not present

## 2018-06-01 DIAGNOSIS — M130 Polyarthritis, unspecified: Secondary | ICD-10-CM | POA: Diagnosis not present

## 2018-06-01 DIAGNOSIS — N069 Isolated proteinuria with unspecified morphologic lesion: Secondary | ICD-10-CM | POA: Insufficient documentation

## 2018-06-01 DIAGNOSIS — Z6831 Body mass index (BMI) 31.0-31.9, adult: Secondary | ICD-10-CM | POA: Diagnosis not present

## 2018-06-01 DIAGNOSIS — E785 Hyperlipidemia, unspecified: Secondary | ICD-10-CM | POA: Diagnosis not present

## 2018-06-05 LAB — URINE CYTOLOGY ANCILLARY ONLY
Bacterial vaginitis: NEGATIVE
CANDIDA VAGINITIS: NEGATIVE
CHLAMYDIA, DNA PROBE: NEGATIVE
NEISSERIA GONORRHEA: NEGATIVE
Trichomonas: NEGATIVE

## 2018-07-12 DIAGNOSIS — D72821 Monocytosis (symptomatic): Secondary | ICD-10-CM | POA: Diagnosis not present

## 2018-07-12 DIAGNOSIS — I10 Essential (primary) hypertension: Secondary | ICD-10-CM | POA: Diagnosis not present

## 2018-07-12 DIAGNOSIS — Z0001 Encounter for general adult medical examination with abnormal findings: Secondary | ICD-10-CM | POA: Diagnosis not present

## 2018-07-12 DIAGNOSIS — M1710 Unilateral primary osteoarthritis, unspecified knee: Secondary | ICD-10-CM | POA: Diagnosis not present

## 2018-07-12 DIAGNOSIS — E559 Vitamin D deficiency, unspecified: Secondary | ICD-10-CM | POA: Diagnosis not present

## 2018-07-12 DIAGNOSIS — E785 Hyperlipidemia, unspecified: Secondary | ICD-10-CM | POA: Diagnosis not present

## 2018-07-12 DIAGNOSIS — Z0000 Encounter for general adult medical examination without abnormal findings: Secondary | ICD-10-CM | POA: Diagnosis not present

## 2018-07-12 DIAGNOSIS — M549 Dorsalgia, unspecified: Secondary | ICD-10-CM | POA: Diagnosis not present

## 2018-07-18 ENCOUNTER — Other Ambulatory Visit: Payer: Self-pay | Admitting: Internal Medicine

## 2018-07-18 ENCOUNTER — Other Ambulatory Visit: Payer: Self-pay

## 2018-07-18 ENCOUNTER — Ambulatory Visit
Admission: RE | Admit: 2018-07-18 | Discharge: 2018-07-18 | Disposition: A | Discharge status: Home / Self Care | Payer: Medicare HMO | Source: Ambulatory Visit | Attending: Internal Medicine | Admitting: Internal Medicine

## 2018-07-18 DIAGNOSIS — M545 Low back pain, unspecified: Secondary | ICD-10-CM

## 2018-07-18 DIAGNOSIS — M25572 Pain in left ankle and joints of left foot: Secondary | ICD-10-CM

## 2018-07-24 DIAGNOSIS — M47817 Spondylosis without myelopathy or radiculopathy, lumbosacral region: Secondary | ICD-10-CM | POA: Diagnosis not present

## 2018-07-24 DIAGNOSIS — R945 Abnormal results of liver function studies: Secondary | ICD-10-CM | POA: Diagnosis not present

## 2018-07-24 DIAGNOSIS — M549 Dorsalgia, unspecified: Secondary | ICD-10-CM | POA: Diagnosis not present

## 2018-07-24 DIAGNOSIS — I10 Essential (primary) hypertension: Secondary | ICD-10-CM | POA: Diagnosis not present

## 2018-12-17 DIAGNOSIS — E785 Hyperlipidemia, unspecified: Secondary | ICD-10-CM | POA: Diagnosis not present

## 2018-12-17 DIAGNOSIS — M839 Adult osteomalacia, unspecified: Secondary | ICD-10-CM | POA: Diagnosis not present

## 2018-12-17 DIAGNOSIS — M47817 Spondylosis without myelopathy or radiculopathy, lumbosacral region: Secondary | ICD-10-CM | POA: Diagnosis not present

## 2018-12-17 DIAGNOSIS — R945 Abnormal results of liver function studies: Secondary | ICD-10-CM | POA: Diagnosis not present

## 2018-12-17 DIAGNOSIS — I10 Essential (primary) hypertension: Secondary | ICD-10-CM | POA: Diagnosis not present

## 2018-12-17 DIAGNOSIS — Z130 Encounter for screening for diseases of the blood and blood-forming organs and certain disorders involving the immune mechanism: Secondary | ICD-10-CM | POA: Diagnosis not present

## 2018-12-17 DIAGNOSIS — M25561 Pain in right knee: Secondary | ICD-10-CM | POA: Diagnosis not present

## 2018-12-17 DIAGNOSIS — M545 Low back pain: Secondary | ICD-10-CM | POA: Diagnosis not present

## 2019-01-11 DIAGNOSIS — M25561 Pain in right knee: Secondary | ICD-10-CM | POA: Diagnosis not present

## 2019-01-18 DIAGNOSIS — I10 Essential (primary) hypertension: Secondary | ICD-10-CM | POA: Diagnosis not present

## 2019-01-18 DIAGNOSIS — M25561 Pain in right knee: Secondary | ICD-10-CM | POA: Diagnosis not present

## 2019-01-18 DIAGNOSIS — H672 Otitis media in diseases classified elsewhere, left ear: Secondary | ICD-10-CM | POA: Diagnosis not present

## 2019-01-18 DIAGNOSIS — E785 Hyperlipidemia, unspecified: Secondary | ICD-10-CM | POA: Diagnosis not present

## 2019-01-18 DIAGNOSIS — M545 Low back pain: Secondary | ICD-10-CM | POA: Diagnosis not present

## 2019-03-13 DIAGNOSIS — I10 Essential (primary) hypertension: Secondary | ICD-10-CM | POA: Diagnosis not present

## 2019-03-13 DIAGNOSIS — H672 Otitis media in diseases classified elsewhere, left ear: Secondary | ICD-10-CM | POA: Diagnosis not present

## 2019-03-13 DIAGNOSIS — M47817 Spondylosis without myelopathy or radiculopathy, lumbosacral region: Secondary | ICD-10-CM | POA: Diagnosis not present

## 2019-03-13 DIAGNOSIS — M545 Low back pain: Secondary | ICD-10-CM | POA: Diagnosis not present

## 2019-03-13 DIAGNOSIS — E785 Hyperlipidemia, unspecified: Secondary | ICD-10-CM | POA: Diagnosis not present

## 2019-04-16 ENCOUNTER — Other Ambulatory Visit: Payer: Self-pay

## 2019-04-16 DIAGNOSIS — Z20822 Contact with and (suspected) exposure to covid-19: Secondary | ICD-10-CM

## 2019-04-18 LAB — NOVEL CORONAVIRUS, NAA: SARS-CoV-2, NAA: NOT DETECTED

## 2019-05-15 DIAGNOSIS — H35413 Lattice degeneration of retina, bilateral: Secondary | ICD-10-CM | POA: Diagnosis not present

## 2019-05-15 DIAGNOSIS — Q141 Congenital malformation of retina: Secondary | ICD-10-CM | POA: Diagnosis not present

## 2019-05-15 DIAGNOSIS — H25813 Combined forms of age-related cataract, bilateral: Secondary | ICD-10-CM | POA: Diagnosis not present

## 2019-05-27 ENCOUNTER — Encounter (INDEPENDENT_AMBULATORY_CARE_PROVIDER_SITE_OTHER): Payer: Medicare HMO | Admitting: Ophthalmology

## 2019-05-27 DIAGNOSIS — H43813 Vitreous degeneration, bilateral: Secondary | ICD-10-CM | POA: Diagnosis not present

## 2019-05-27 DIAGNOSIS — H2513 Age-related nuclear cataract, bilateral: Secondary | ICD-10-CM | POA: Diagnosis not present

## 2019-05-27 DIAGNOSIS — H35413 Lattice degeneration of retina, bilateral: Secondary | ICD-10-CM

## 2019-05-27 DIAGNOSIS — I10 Essential (primary) hypertension: Secondary | ICD-10-CM

## 2019-05-27 DIAGNOSIS — H33303 Unspecified retinal break, bilateral: Secondary | ICD-10-CM

## 2019-05-27 DIAGNOSIS — H35033 Hypertensive retinopathy, bilateral: Secondary | ICD-10-CM

## 2019-06-03 ENCOUNTER — Encounter (INDEPENDENT_AMBULATORY_CARE_PROVIDER_SITE_OTHER): Payer: Medicare HMO | Admitting: Ophthalmology

## 2019-06-03 DIAGNOSIS — H33302 Unspecified retinal break, left eye: Secondary | ICD-10-CM | POA: Diagnosis not present

## 2019-06-05 ENCOUNTER — Ambulatory Visit: Payer: Medicare HMO | Attending: Internal Medicine

## 2019-06-05 DIAGNOSIS — Z20822 Contact with and (suspected) exposure to covid-19: Secondary | ICD-10-CM

## 2019-06-06 LAB — NOVEL CORONAVIRUS, NAA: SARS-CoV-2, NAA: NOT DETECTED

## 2019-06-11 ENCOUNTER — Encounter (INDEPENDENT_AMBULATORY_CARE_PROVIDER_SITE_OTHER): Payer: Medicare HMO | Admitting: Ophthalmology

## 2019-06-11 DIAGNOSIS — H33303 Unspecified retinal break, bilateral: Secondary | ICD-10-CM

## 2019-08-19 DIAGNOSIS — M4696 Unspecified inflammatory spondylopathy, lumbar region: Secondary | ICD-10-CM | POA: Diagnosis not present

## 2019-08-19 DIAGNOSIS — I10 Essential (primary) hypertension: Secondary | ICD-10-CM | POA: Diagnosis not present

## 2019-08-19 DIAGNOSIS — Z131 Encounter for screening for diabetes mellitus: Secondary | ICD-10-CM | POA: Diagnosis not present

## 2019-08-19 DIAGNOSIS — E785 Hyperlipidemia, unspecified: Secondary | ICD-10-CM | POA: Diagnosis not present

## 2019-08-19 DIAGNOSIS — R7303 Prediabetes: Secondary | ICD-10-CM | POA: Diagnosis not present

## 2019-08-19 DIAGNOSIS — Z1152 Encounter for screening for COVID-19: Secondary | ICD-10-CM | POA: Diagnosis not present

## 2019-08-19 DIAGNOSIS — R82998 Other abnormal findings in urine: Secondary | ICD-10-CM | POA: Diagnosis not present

## 2019-08-19 DIAGNOSIS — R0602 Shortness of breath: Secondary | ICD-10-CM | POA: Diagnosis not present

## 2019-08-19 DIAGNOSIS — M545 Low back pain: Secondary | ICD-10-CM | POA: Diagnosis not present

## 2019-08-19 DIAGNOSIS — Z1389 Encounter for screening for other disorder: Secondary | ICD-10-CM | POA: Diagnosis not present

## 2019-08-19 DIAGNOSIS — Z20822 Contact with and (suspected) exposure to covid-19: Secondary | ICD-10-CM | POA: Diagnosis not present

## 2019-08-27 ENCOUNTER — Other Ambulatory Visit: Payer: Self-pay | Admitting: Internal Medicine

## 2019-08-27 DIAGNOSIS — N6489 Other specified disorders of breast: Secondary | ICD-10-CM

## 2019-09-13 ENCOUNTER — Other Ambulatory Visit: Payer: Self-pay | Admitting: Nurse Practitioner

## 2019-09-16 ENCOUNTER — Other Ambulatory Visit: Payer: Self-pay | Admitting: Nurse Practitioner

## 2019-09-17 ENCOUNTER — Ambulatory Visit: Payer: Medicare HMO

## 2019-09-17 ENCOUNTER — Ambulatory Visit
Admission: RE | Admit: 2019-09-17 | Discharge: 2019-09-17 | Disposition: A | Discharge status: Home / Self Care | Payer: Medicare HMO | Source: Ambulatory Visit | Attending: Internal Medicine | Admitting: Internal Medicine

## 2019-09-17 ENCOUNTER — Other Ambulatory Visit: Payer: Self-pay

## 2019-09-17 DIAGNOSIS — N6489 Other specified disorders of breast: Secondary | ICD-10-CM

## 2019-09-17 DIAGNOSIS — R928 Other abnormal and inconclusive findings on diagnostic imaging of breast: Secondary | ICD-10-CM | POA: Diagnosis not present

## 2019-09-23 DIAGNOSIS — I10 Essential (primary) hypertension: Secondary | ICD-10-CM | POA: Diagnosis not present

## 2019-09-23 DIAGNOSIS — M25561 Pain in right knee: Secondary | ICD-10-CM | POA: Diagnosis not present

## 2019-09-23 DIAGNOSIS — J301 Allergic rhinitis due to pollen: Secondary | ICD-10-CM | POA: Diagnosis not present

## 2019-09-23 DIAGNOSIS — M47817 Spondylosis without myelopathy or radiculopathy, lumbosacral region: Secondary | ICD-10-CM | POA: Diagnosis not present

## 2019-09-23 DIAGNOSIS — M545 Low back pain: Secondary | ICD-10-CM | POA: Diagnosis not present

## 2019-09-23 DIAGNOSIS — E785 Hyperlipidemia, unspecified: Secondary | ICD-10-CM | POA: Diagnosis not present

## 2019-09-23 DIAGNOSIS — M839 Adult osteomalacia, unspecified: Secondary | ICD-10-CM | POA: Diagnosis not present

## 2019-09-23 DIAGNOSIS — H672 Otitis media in diseases classified elsewhere, left ear: Secondary | ICD-10-CM | POA: Diagnosis not present

## 2019-09-23 DIAGNOSIS — R0789 Other chest pain: Secondary | ICD-10-CM | POA: Diagnosis not present

## 2019-09-24 ENCOUNTER — Ambulatory Visit
Admission: RE | Admit: 2019-09-24 | Discharge: 2019-09-24 | Disposition: A | Discharge status: Home / Self Care | Payer: Medicare HMO | Source: Ambulatory Visit | Attending: Internal Medicine | Admitting: Internal Medicine

## 2019-09-24 ENCOUNTER — Other Ambulatory Visit: Payer: Self-pay | Admitting: Psychiatry

## 2019-09-24 ENCOUNTER — Other Ambulatory Visit: Payer: Self-pay | Admitting: Internal Medicine

## 2019-09-24 DIAGNOSIS — R079 Chest pain, unspecified: Secondary | ICD-10-CM

## 2019-10-09 ENCOUNTER — Encounter (INDEPENDENT_AMBULATORY_CARE_PROVIDER_SITE_OTHER): Payer: Medicare HMO | Admitting: Ophthalmology

## 2019-10-10 ENCOUNTER — Encounter (INDEPENDENT_AMBULATORY_CARE_PROVIDER_SITE_OTHER): Payer: Medicare HMO | Admitting: Ophthalmology

## 2019-10-16 ENCOUNTER — Encounter (INDEPENDENT_AMBULATORY_CARE_PROVIDER_SITE_OTHER): Payer: Medicare HMO | Admitting: Ophthalmology

## 2019-10-30 ENCOUNTER — Other Ambulatory Visit: Payer: Self-pay

## 2019-10-30 ENCOUNTER — Encounter (INDEPENDENT_AMBULATORY_CARE_PROVIDER_SITE_OTHER): Payer: Medicare HMO | Admitting: Ophthalmology

## 2019-10-30 DIAGNOSIS — D3131 Benign neoplasm of right choroid: Secondary | ICD-10-CM | POA: Diagnosis not present

## 2019-10-30 DIAGNOSIS — H33303 Unspecified retinal break, bilateral: Secondary | ICD-10-CM

## 2019-10-30 DIAGNOSIS — H43813 Vitreous degeneration, bilateral: Secondary | ICD-10-CM

## 2019-10-30 DIAGNOSIS — H2513 Age-related nuclear cataract, bilateral: Secondary | ICD-10-CM

## 2019-10-30 DIAGNOSIS — H35033 Hypertensive retinopathy, bilateral: Secondary | ICD-10-CM | POA: Diagnosis not present

## 2019-10-30 DIAGNOSIS — I10 Essential (primary) hypertension: Secondary | ICD-10-CM | POA: Diagnosis not present

## 2020-02-13 ENCOUNTER — Ambulatory Visit: Payer: Self-pay | Admitting: Cardiology

## 2020-02-20 ENCOUNTER — Ambulatory Visit: Payer: Self-pay | Admitting: Cardiology

## 2020-02-22 NOTE — Progress Notes (Signed)
Patient referred by [name], [name], NP for murmur  Subjective:   Amy Vincent, female    DOB: 1955/08/04, 64 y.o.   MRN: [id number]   Chief Complaint  Patient presents with   Heart Murmur   New Patient (Initial Visit)     HPI  64 y.o. African American female with hypertension, hyperlipidemia, referred for evaluation of murmur  Patient denies chest pain, palpitations, leg edema, orthopnea, PND, TIA/syncope. She has occasional exertional dyspnea, only with more than usual activity.   Past Medical History:  Diagnosis Date   High cholesterol    Hx of adenomatous polyp of rectum 07/07/2016   Hypertension      Past Surgical History:  Procedure Laterality Date   ABDOMINAL HYSTERECTOMY  1999   COLONOSCOPY  2006   Dr. Mann/ Normal Colonoscopy   INCISION AND DRAINAGE     I & D of an abscess on back   left arm surgery  2004   orthopedic surgery left arm due to fracture as a child that healed improperly     Social History   Tobacco Use  Smoking Status Former Smoker   Quit date: 05/09/1994   Years since quitting: 25.8  Smokeless Tobacco Never Used    Social History   Substance and Sexual Activity  Alcohol Use Yes   Alcohol/week: 5.0 standard drinks   Types: 3 Standard drinks or equivalent, 2 Shots of liquor per week   Comment: 2 a week     Family History  Problem Relation Age of Onset   Cancer Mother 53       breast   Breast cancer Mother 36   Diabetes Father    Cancer Maternal Aunt 42       breast   Breast cancer Maternal Aunt    Cancer Maternal Aunt        stomach   Breast cancer Maternal Aunt    Hypertension Paternal Uncle    Cancer Paternal Uncle 38       colon cancer   Breast cancer Cousin    Colon polyps Neg Hx      Current Outpatient Medications on File Prior to Visit  Medication Sig Dispense Refill   atorvastatin (LIPITOR) 20 MG tablet Take 20 mg by mouth daily.     DILT-XR 240 MG 24 hr capsule Take 240 mg  by mouth daily at 12 noon.     losartan-hydrochlorothiazide (HYZAAR) 100-25 MG tablet Take 1 tablet by mouth daily.     meloxicam (MOBIC) 7.5 MG tablet Take 1 tablet (7.5 mg total) by mouth daily. (Patient not taking: Reported on 02/24/2018) 20 tablet 0   methocarbamol (ROBAXIN) 500 MG tablet Take 1 tablet (500 mg total) by mouth 2 (two) times daily. 14 tablet 0   metoprolol succinate (TOPROL-XL) 25 MG 24 hr tablet Take 25 mg by mouth daily at 12 noon.     naproxen (NAPROSYN) 375 MG tablet Take 1 tablet (375 mg total) by mouth 2 (two) times daily. 20 tablet 0   predniSONE (DELTASONE) 10 MG tablet Four tablet a day for 4 days (Patient not taking: Reported on 02/24/2018) 16 tablet 0   Current Facility-Administered Medications on File Prior to Visit  Medication Dose Route Frequency Provider Last Rate Last Admin   0.9 %  sodium chloride infusion  500 mL Intravenous Continuous Gatha Mayer, MD        Cardiovascular and other pertinent studies:  EKG 02/28/2020: Sinus rhythm 64 bpm Left atrial  enlargement    Recent labs: 01/07/2020: Glucose 83, BUN/Cr 20/0.85. EGFR 84. Na/K 142/4.6. Rest of the CMP normal H/H 14.1/41.9. MCV 89.3. Platelets 238 HbA1C 5.3% Chol 183, TG 160, HDL 60, LDL 97    Review of Systems  Cardiovascular: Positive for dyspnea on exertion. Negative for chest pain, leg swelling, palpitations and syncope.         Vitals:   02/28/20 1234  BP: 115/75  Pulse: 65  Resp: 16  SpO2: 96%     Body mass index is 32.43 kg/m. Filed Weights   02/28/20 1234  Weight: 186 lb (84.4 kg)     Objective:   Physical Exam Vitals and nursing note reviewed.  Constitutional:      General: She is not in acute distress. Neck:     Vascular: No JVD.  Cardiovascular:     Rate and Rhythm: Normal rate and regular rhythm.     Pulses: Normal pulses.     Heart sounds: Murmur heard.  Early systolic murmur is present with a grade of 2/6 at the upper right sternal border.    Pulmonary:     Effort: Pulmonary effort is normal.     Breath sounds: Normal breath sounds. No wheezing or rales.          Assessment & Recommendations:   64 y.o. African American female with hypertension, hyperlipidemia, referred for evaluation of murmur  Soft early systolic murmur at RUSB. Will obtain echocardiogram. Unless more than moderate valvular abnormalities noted, I will see her on as needed basis.      Thank you for referring the patient to [place]. Please feel free to contact with any questions.   Nigel Mormon, MD Pager: [phone] Office: [phone]

## 2020-02-28 ENCOUNTER — Ambulatory Visit: Payer: Medicare HMO | Admitting: Cardiology

## 2020-02-28 ENCOUNTER — Other Ambulatory Visit: Payer: Self-pay

## 2020-02-28 ENCOUNTER — Encounter: Payer: Self-pay | Admitting: Cardiology

## 2020-02-28 VITALS — BP 115/75 | HR 65 | Resp 16 | Ht 63.5 in | Wt 186.0 lb

## 2020-02-28 DIAGNOSIS — R011 Cardiac murmur, unspecified: Secondary | ICD-10-CM

## 2020-03-04 ENCOUNTER — Ambulatory Visit: Payer: Medicare Other

## 2020-03-04 ENCOUNTER — Other Ambulatory Visit: Payer: Medicare Other

## 2020-03-04 ENCOUNTER — Other Ambulatory Visit: Payer: Self-pay

## 2020-03-04 DIAGNOSIS — R011 Cardiac murmur, unspecified: Secondary | ICD-10-CM

## 2020-06-01 IMAGING — CR LUMBAR SPINE - COMPLETE 4+ VIEW
5 series · 5 of 5 positions shown · non-contrast
Comparison: MRI lumbar spine 09/19/2015

CLINICAL DATA: Chronic low back pain.

EXAM:
LUMBAR SPINE - COMPLETE 4+ VIEW

[w lumbar spine ap]
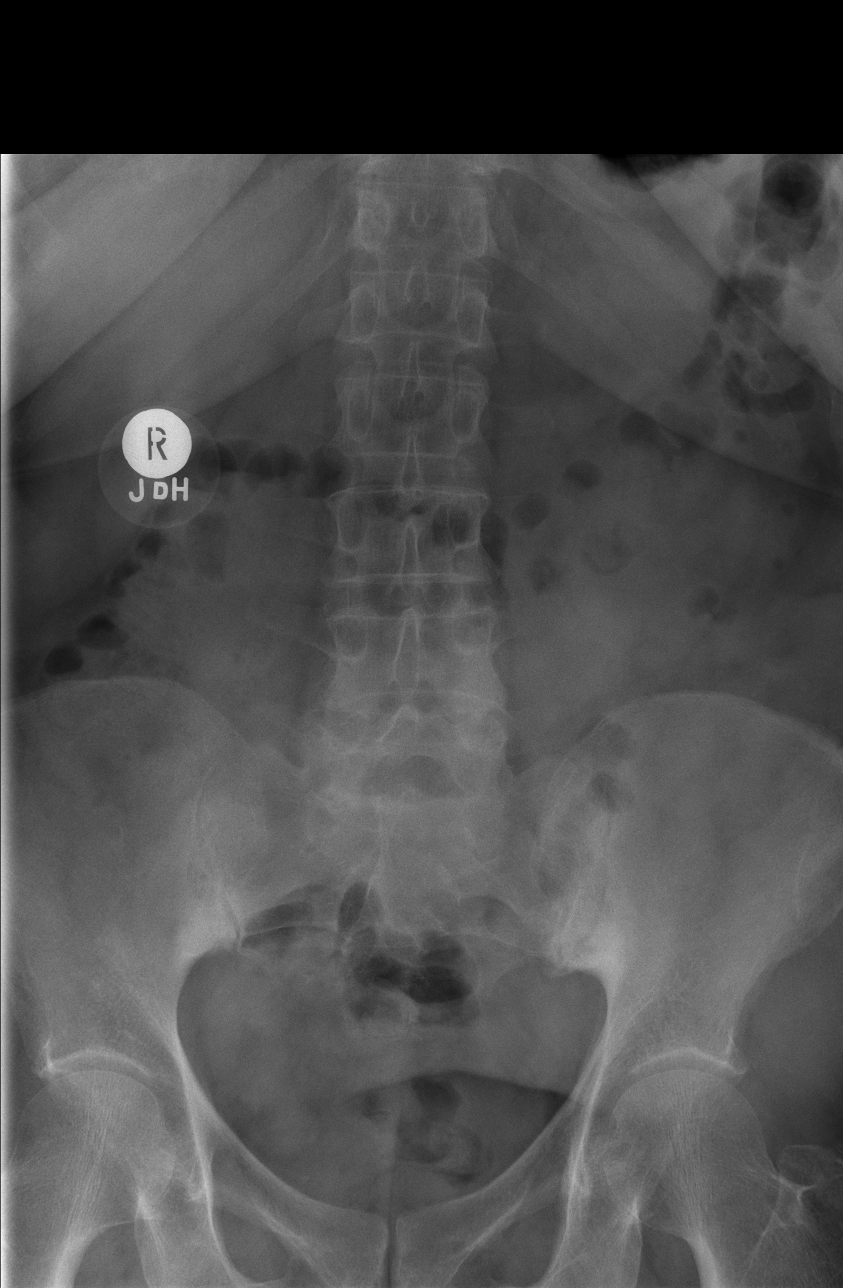

[w lumbar spine obl (1 of 2)]
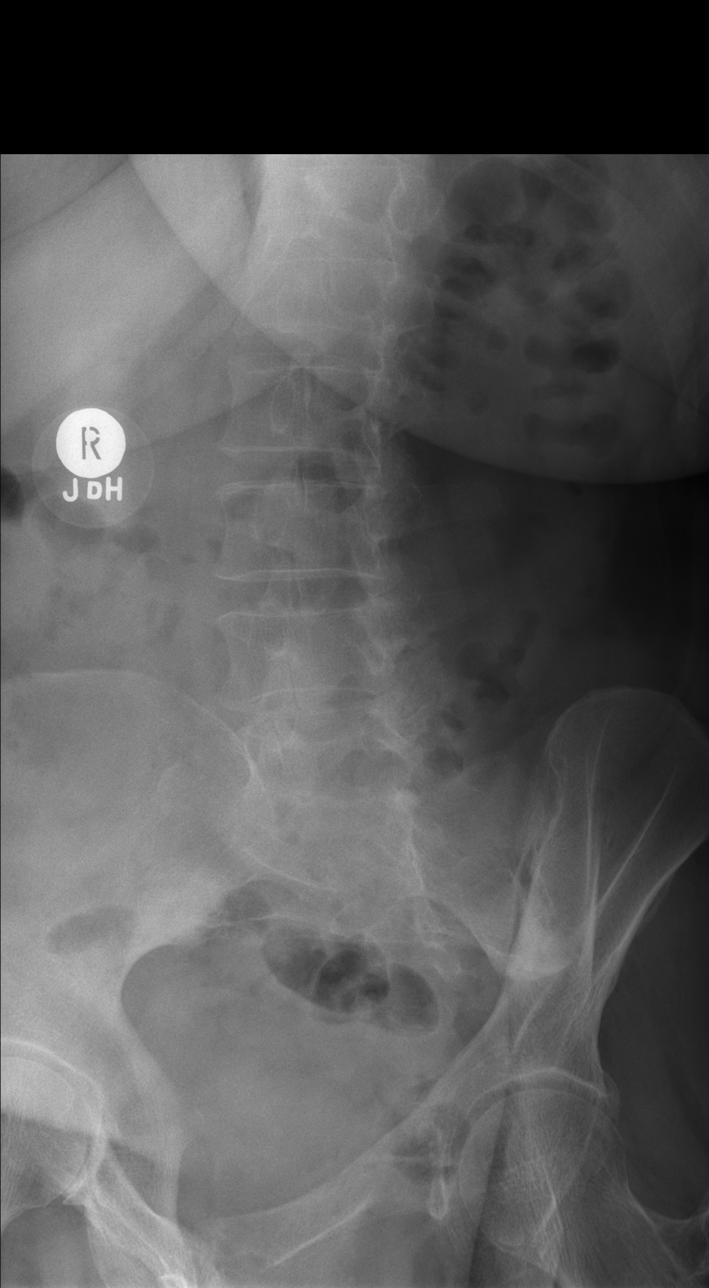

[w lumbar spine obl (2 of 2)]
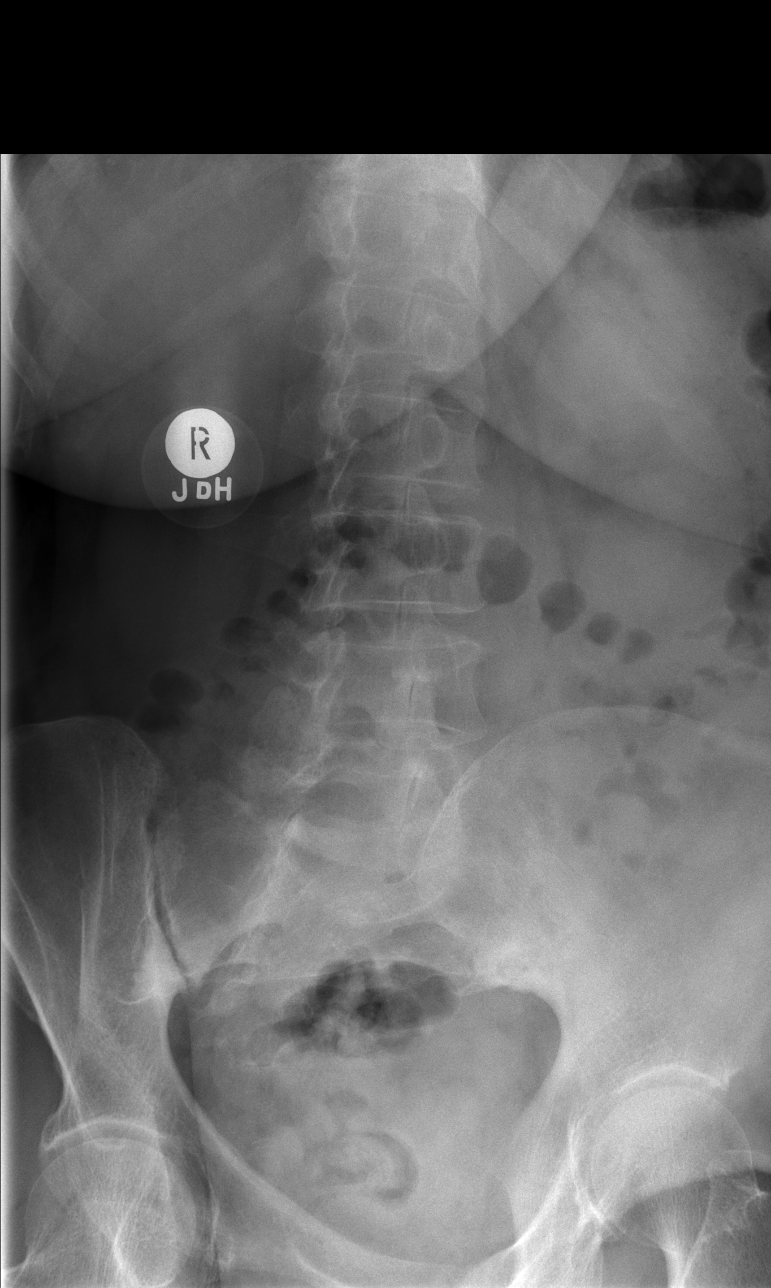

[w lumbar spine lat]
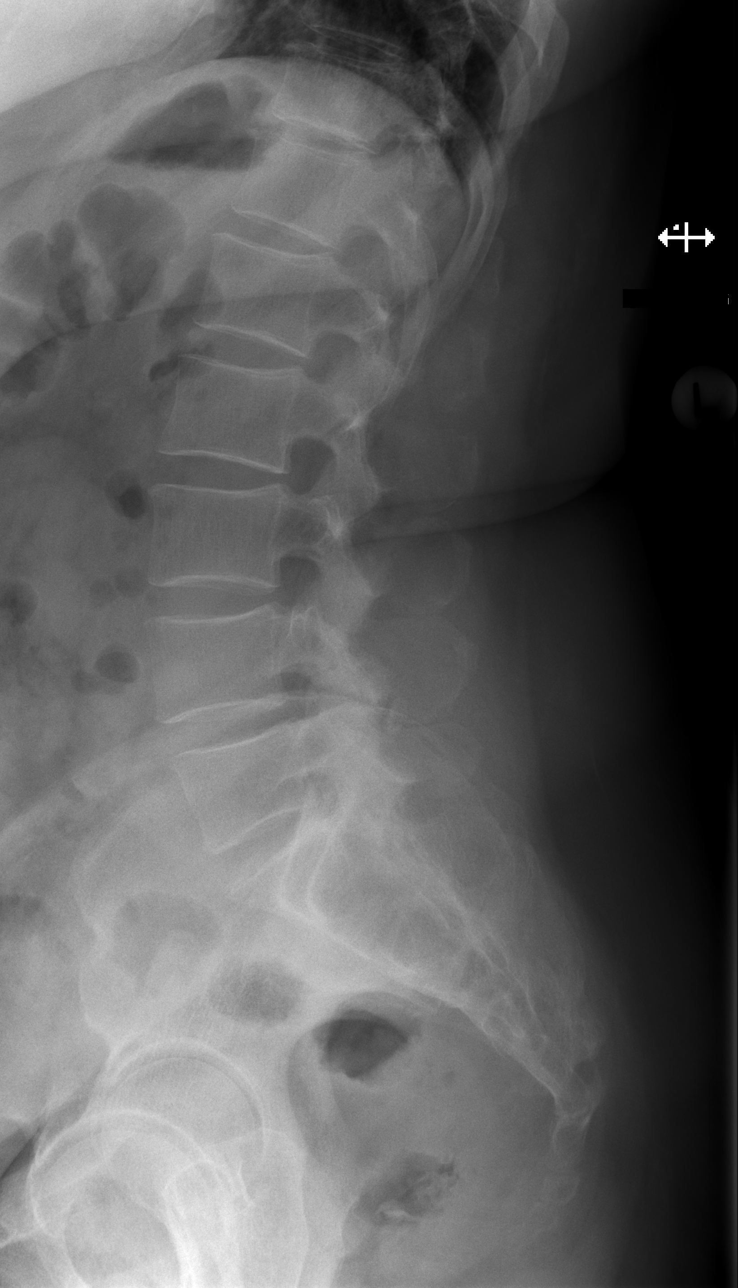

[w lumbar l-5 s-1 spot]
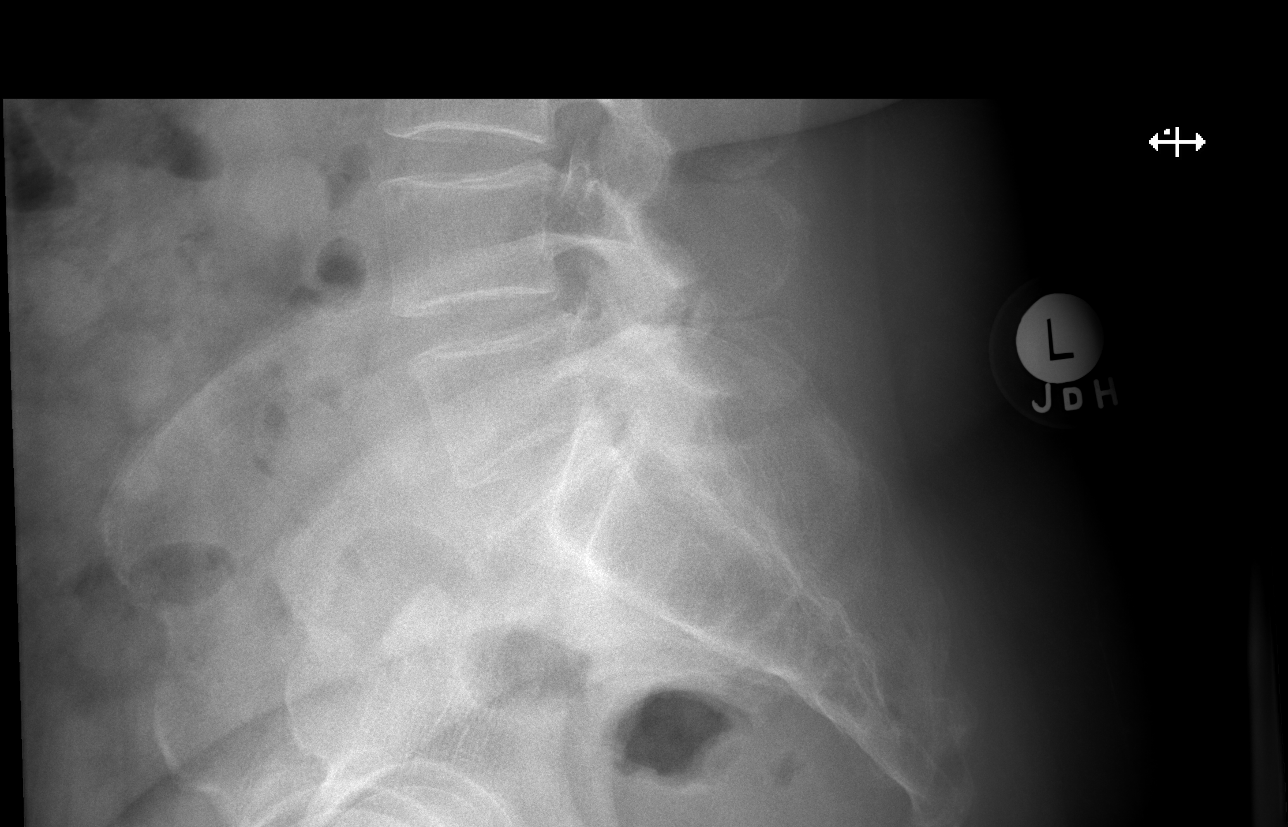

[5 of 5 positions shown; findings below may reference images not displayed]

FINDINGS: Normal alignment of the lumbar spine. The vertebral body heights and
disc spaces are maintained. Again noted is degenerative facet
disease at L4-L5 and L5-S1. No evidence for a pars defect.
IMPRESSION: No acute abnormality in the lumbar spine.

## 2020-06-22 ENCOUNTER — Other Ambulatory Visit: Payer: Self-pay

## 2020-06-22 ENCOUNTER — Other Ambulatory Visit: Payer: Self-pay | Admitting: Internal Medicine

## 2020-06-22 ENCOUNTER — Ambulatory Visit
Admission: RE | Admit: 2020-06-22 | Discharge: 2020-06-22 | Disposition: A | Discharge status: Home / Self Care | Payer: Medicare HMO | Source: Ambulatory Visit | Attending: Internal Medicine | Admitting: Internal Medicine

## 2020-06-22 DIAGNOSIS — I10 Essential (primary) hypertension: Secondary | ICD-10-CM

## 2020-06-22 DIAGNOSIS — R0600 Dyspnea, unspecified: Secondary | ICD-10-CM

## 2020-10-30 ENCOUNTER — Other Ambulatory Visit: Payer: Self-pay | Admitting: Internal Medicine

## 2020-10-30 DIAGNOSIS — Z1231 Encounter for screening mammogram for malignant neoplasm of breast: Secondary | ICD-10-CM

## 2020-12-23 ENCOUNTER — Ambulatory Visit
Admission: RE | Admit: 2020-12-23 | Discharge: 2020-12-23 | Disposition: A | Discharge status: Home / Self Care | Payer: Medicare HMO | Source: Ambulatory Visit | Attending: Internal Medicine | Admitting: Internal Medicine

## 2020-12-23 DIAGNOSIS — Z1231 Encounter for screening mammogram for malignant neoplasm of breast: Secondary | ICD-10-CM

## 2021-11-02 ENCOUNTER — Encounter: Payer: Self-pay | Admitting: Internal Medicine

## 2022-09-01 ENCOUNTER — Other Ambulatory Visit: Payer: Self-pay | Admitting: Internal Medicine

## 2022-09-01 DIAGNOSIS — Z1231 Encounter for screening mammogram for malignant neoplasm of breast: Secondary | ICD-10-CM

## 2022-09-02 ENCOUNTER — Ambulatory Visit
Admission: RE | Admit: 2022-09-02 | Discharge: 2022-09-02 | Disposition: A | Discharge status: Home / Self Care | Payer: Medicare HMO | Source: Ambulatory Visit | Attending: Internal Medicine | Admitting: Internal Medicine

## 2022-09-02 DIAGNOSIS — Z1231 Encounter for screening mammogram for malignant neoplasm of breast: Secondary | ICD-10-CM

## 2022-09-07 ENCOUNTER — Other Ambulatory Visit: Payer: Self-pay | Admitting: Internal Medicine

## 2022-09-07 DIAGNOSIS — R928 Other abnormal and inconclusive findings on diagnostic imaging of breast: Secondary | ICD-10-CM

## 2022-09-21 ENCOUNTER — Ambulatory Visit
Admission: RE | Admit: 2022-09-21 | Discharge: 2022-09-21 | Disposition: A | Discharge status: Home / Self Care | Payer: Medicare HMO | Source: Ambulatory Visit | Attending: Internal Medicine | Admitting: Internal Medicine

## 2022-09-21 ENCOUNTER — Other Ambulatory Visit: Payer: Self-pay | Admitting: Internal Medicine

## 2022-09-21 DIAGNOSIS — R928 Other abnormal and inconclusive findings on diagnostic imaging of breast: Secondary | ICD-10-CM

## 2022-09-21 DIAGNOSIS — N6489 Other specified disorders of breast: Secondary | ICD-10-CM

## 2022-09-22 ENCOUNTER — Ambulatory Visit
Admission: RE | Admit: 2022-09-22 | Discharge: 2022-09-22 | Disposition: A | Discharge status: Home / Self Care | Payer: Medicare HMO | Source: Ambulatory Visit | Attending: Internal Medicine | Admitting: Internal Medicine

## 2022-09-22 DIAGNOSIS — N6489 Other specified disorders of breast: Secondary | ICD-10-CM

## 2022-09-22 DIAGNOSIS — C50919 Malignant neoplasm of unspecified site of unspecified female breast: Secondary | ICD-10-CM

## 2022-09-22 DIAGNOSIS — R928 Other abnormal and inconclusive findings on diagnostic imaging of breast: Secondary | ICD-10-CM

## 2022-09-22 HISTORY — DX: Malignant neoplasm of unspecified site of unspecified female breast: C50.919

## 2022-09-22 HISTORY — PX: BREAST BIOPSY: SHX20

## 2022-09-23 ENCOUNTER — Telehealth: Payer: Self-pay

## 2022-10-04 ENCOUNTER — Ambulatory Visit: Payer: Self-pay | Admitting: Surgery

## 2022-10-04 DIAGNOSIS — D0512 Intraductal carcinoma in situ of left breast: Secondary | ICD-10-CM

## 2022-10-04 NOTE — H&P (View-Only) (Signed)
 Subjective   Chief Complaint: New Consultation and Breast Cancer     History of Present Illness: Amy Vincent is a 67 y.o. female who is seen today as an office consultation at the request of Dr. Dean for evaluation of New Consultation and Breast Cancer .    This is a 67-year-old female who presents with a strong family history of breast cancer.  Her mother passed away at age 37 of breast cancer.  Almost all of her multiple maternal aunts have also been diagnosed with breast cancer.  She does not remember any genetic testing.  The patient has 1 sister with whom she shares a mother.  No children.  No previous breast problems.  Recently she underwent routine screening mammogram that revealed possible asymmetry in the left lower outer quadrant.  There was no sonographic correlate.  She underwent stereotactic biopsy of this area on 09/22/2022.  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  She presents now along with a friend for discussion of treatment.   Review of Systems: A complete review of systems was obtained from the patient.  I have reviewed this information and discussed as appropriate with the patient.  See HPI as well for other ROS.  Review of Systems  Constitutional: Negative.   HENT: Negative.    Eyes: Negative.   Respiratory: Negative.    Cardiovascular: Negative.   Gastrointestinal: Negative.   Genitourinary: Negative.   Musculoskeletal: Negative.   Skin: Negative.   Neurological: Negative.   Endo/Heme/Allergies: Negative.   Psychiatric/Behavioral: Negative.        Medical History: Past Medical History:  Diagnosis Date   Hyperlipidemia    Hypertension     Patient Active Problem List  Diagnosis   Chronic low back pain   Depression   FH: breast cancer in first degree relative when <50 years old   Ganglion cyst of flexor tendon sheath of finger of right hand   Hyperglycemia   Hyperlipidemia    Past Surgical History:   Procedure Laterality Date   HYSTERECTOMY       No Known Allergies  Current Outpatient Medications on File Prior to Visit  Medication Sig Dispense Refill   amLODIPine (NORVASC) 10 MG tablet      atenoloL (TENORMIN) 50 MG tablet Take 50 mg by mouth once daily     atorvastatin (LIPITOR) 20 MG tablet Take 20 mg by mouth once daily     HYDROcodone-acetaminophen (NORCO) 5-325 mg tablet as needed     losartan-hydroCHLOROthiazide (HYZAAR) 100-25 mg tablet Take 1 tablet by mouth once daily     meloxicam (MOBIC) 7.5 MG tablet Take 7.5 mg by mouth once daily     No current facility-administered medications on file prior to visit.    Family History  Problem Relation Age of Onset   High blood pressure (Hypertension) Mother    Hyperlipidemia (Elevated cholesterol) Mother    Breast cancer Mother    Deep vein thrombosis (DVT or abnormal blood clot formation) Father    High blood pressure (Hypertension) Father    Hyperlipidemia (Elevated cholesterol) Father    High blood pressure (Hypertension) Sister    High blood pressure (Hypertension) Brother      Social History   Tobacco Use  Smoking Status Former   Types: Cigarettes  Smokeless Tobacco Never     Social History   Socioeconomic History   Marital status: Single  Tobacco Use   Smoking status: Former    Types: Cigarettes     Smokeless tobacco: Never  Substance and Sexual Activity   Alcohol use: Yes    Alcohol/week: 2.0 standard drinks of alcohol    Types: 2 Standard drinks or equivalent per week   Drug use: Yes    Objective:    Physical Exam   Constitutional:  WDWN in NAD, conversant, no obvious deformities; lying in bed comfortably Eyes:  Pupils equal, round; sclera anicteric; moist conjunctiva; no lid lag HENT:  Oral mucosa moist; good dentition  Neck:  No masses palpated, trachea midline; no thyromegaly Lungs:  CTA bilaterally; normal respiratory effort Breasts:  symmetric, no nipple changes; no palpable masses or  lymphadenopathy on either side.  The left lower outer breast shows significant ecchymosis and slight indistinct fullness consistent with deep hematoma.  No sign of infection. CV:  Regular rate and rhythm; no murmurs; extremities well-perfused with no edema Abd:  +bowel sounds, soft, non-tender, no palpable organomegaly; no palpable hernias Musc: Normal gait; no apparent clubbing or cyanosis in extremities Lymphatic:  No palpable cervical or axillary lymphadenopathy Skin:  Warm, dry; no sign of jaundice Psychiatric - alert and oriented x 4; calm mood and affect   Labs, Imaging and Diagnostic Testing: CLINICAL DATA:  Patient was recalled from screening mammogram for a possible asymmetry in the left breast. Strong family history of breast cancer. The patient's mother was diagnosed with breast cancer at the age of 37.   EXAM: DIGITAL DIAGNOSTIC UNILATERAL LEFT MAMMOGRAM WITH TOMOSYNTHESIS; ULTRASOUND LEFT BREAST LIMITED   TECHNIQUE: Left digital diagnostic mammography and breast tomosynthesis was performed.; Targeted ultrasound examination of the left breast was performed.   COMPARISON:  Previous exam(s).   ACR Breast Density Category b: There are scattered areas of fibroglandular density.   FINDINGS: Additional imaging of the left breast was performed. There is persistent asymmetry in the lower outer quadrant of the left breast seen on the cc view. There are no malignant type microcalcifications.   Targeted ultrasound is performed, showing normal tissue in the lower outer quadrant of the left breast. No solid or cystic mass, abnormal shadowing or distortion visualized. Sonographic evaluation the left axilla does not show any enlarged adenopathy.   IMPRESSION: Indeterminate asymmetry in the lower outer quadrant of the left breast.   RECOMMENDATION: Stereotactic biopsy of the asymmetry in the lower outer quadrant of the left breast is recommended.   I have discussed the  findings and recommendations with the patient. If applicable, a reminder letter will be sent to the patient regarding the next appointment.   BI-RADS CATEGORY  1: Negative.   Electronically Signed: By: Dina  Arceo M.D. On: 09/21/2022 16:13  Diagnosis Breast, left, needle core biopsy - DUCTAL CARCINOMA IN SITU, LOW GRADE - NECROSIS: NOT IDENTIFIED - CALCIFICATIONS: NOT IDENTIFIED - DCIS LENGTH: 0.3 CM Diagnosis Note Dr. LeGolvan reviewed the case and concurs with the above diagnosis. A breast prognostic profile (ER and PR) is pending and will be reported in an addendum. [facility] was notified on 09/23/2022. Nilesh Kashikar MD Pathologist, Electronic Signature (Case signed 09/23/2022)  PROGNOSTIC INDICATORS Results: IMMUNOHISTOCHEMICAL AND MORPHOMETRIC ANALYSIS PERFORMED MANUALLY Estrogen Receptor: 100%, POSITIVE, STRONG STAINING INTENSITY Progesterone Receptor: 95%, POSITIVE, STRONG STAINING INTENSITY REFERENCE RANGE ESTROGEN RECEPTOR NEGATIVE 0% POSITIVE =>1% REFERENCE RANGE PROGESTERONE RECEPTOR NEGATIVE 0% POSITIVE =>1% All controls stained appropriately Zhaoli Lane MD Pathologist, Electronic Signature ( Signed 09/26/2022)  Assessment and Plan:  Diagnoses and all orders for this visit:  Intraductal carcinoma in situ of left breast -       Ambulatory Referral to Oncology-Medical -     Ambulatory Referral to Radiation Oncology    I had a long discussion with the patient and her friend about her disease.  We discussed the meaning of ductal carcinoma in situ.  We discussed treatment options.  The patient is a good candidate for breast conservation.  We will recommend left breast radioactive seed localized lumpectomy.The surgical procedure has been discussed with the patient.  Potential risks, benefits, alternative treatments, and expected outcomes have been explained.  All of the patient's questions at this time have been answered.  The  likelihood of reaching the patient's treatment goal is good.  The patient understand the proposed surgical procedure and wishes to proceed.  We will refer the patient to oncology and radiation oncology.  Surgery will likely be followed by radiation and antiestrogens.  Casee Knepp KAI Olen Eaves, MD  10/04/2022 5:57 PM  

## 2022-10-04 NOTE — H&P (Signed)
Subjective   Chief Complaint: New Consultation and Breast Cancer     History of Present Illness: Amy Vincent is a 67 y.o. female who is seen today as an office consultation at the request of Dr. August Saucer for evaluation of New Consultation and Breast Cancer .    This is a 67 year old female who presents with a strong family history of breast cancer.  Her mother passed away at age 69 of breast cancer.  Almost all of her multiple maternal aunts have also been diagnosed with breast cancer.  She does not remember any genetic testing.  The patient has 1 sister with whom she shares a mother.  No children.  No previous breast problems.  Recently she underwent routine screening mammogram that revealed possible asymmetry in the left lower outer quadrant.  There was no sonographic correlate.  She underwent stereotactic biopsy of this area on 09/22/2022.  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  She presents now along with a friend for discussion of treatment.   Review of Systems: A complete review of systems was obtained from the patient.  I have reviewed this information and discussed as appropriate with the patient.  See HPI as well for other ROS.  Review of Systems  Constitutional: Negative.   HENT: Negative.    Eyes: Negative.   Respiratory: Negative.    Cardiovascular: Negative.   Gastrointestinal: Negative.   Genitourinary: Negative.   Musculoskeletal: Negative.   Skin: Negative.   Neurological: Negative.   Endo/Heme/Allergies: Negative.   Psychiatric/Behavioral: Negative.        Medical History: Past Medical History:  Diagnosis Date   Hyperlipidemia    Hypertension     Patient Active Problem List  Diagnosis   Chronic low back pain   Depression   FH: breast cancer in first degree relative when <5 years old   Ganglion cyst of flexor tendon sheath of finger of right hand   Hyperglycemia   Hyperlipidemia    Past Surgical History:   Procedure Laterality Date   HYSTERECTOMY       No Known Allergies  Current Outpatient Medications on File Prior to Visit  Medication Sig Dispense Refill   amLODIPine (NORVASC) 10 MG tablet      atenoloL (TENORMIN) 50 MG tablet Take 50 mg by mouth once daily     atorvastatin (LIPITOR) 20 MG tablet Take 20 mg by mouth once daily     HYDROcodone-acetaminophen (NORCO) 5-325 mg tablet as needed     losartan-hydroCHLOROthiazide (HYZAAR) 100-25 mg tablet Take 1 tablet by mouth once daily     meloxicam (MOBIC) 7.5 MG tablet Take 7.5 mg by mouth once daily     No current facility-administered medications on file prior to visit.    Family History  Problem Relation Age of Onset   High blood pressure (Hypertension) Mother    Hyperlipidemia (Elevated cholesterol) Mother    Breast cancer Mother    Deep vein thrombosis (DVT or abnormal blood clot formation) Father    High blood pressure (Hypertension) Father    Hyperlipidemia (Elevated cholesterol) Father    High blood pressure (Hypertension) Sister    High blood pressure (Hypertension) Brother      Social History   Tobacco Use  Smoking Status Former   Types: Cigarettes  Smokeless Tobacco Never     Social History   Socioeconomic History   Marital status: Single  Tobacco Use   Smoking status: Former    Types: Cigarettes  Smokeless tobacco: Never  Substance and Sexual Activity   Alcohol use: Yes    Alcohol/week: 2.0 standard drinks of alcohol    Types: 2 Standard drinks or equivalent per week   Drug use: Yes    Objective:    Physical Exam   Constitutional:  WDWN in NAD, conversant, no obvious deformities; lying in bed comfortably Eyes:  Pupils equal, round; sclera anicteric; moist conjunctiva; no lid lag HENT:  Oral mucosa moist; good dentition  Neck:  No masses palpated, trachea midline; no thyromegaly Lungs:  CTA bilaterally; normal respiratory effort Breasts:  symmetric, no nipple changes; no palpable masses or  lymphadenopathy on either side.  The left lower outer breast shows significant ecchymosis and slight indistinct fullness consistent with deep hematoma.  No sign of infection. CV:  Regular rate and rhythm; no murmurs; extremities well-perfused with no edema Abd:  +bowel sounds, soft, non-tender, no palpable organomegaly; no palpable hernias Musc: Normal gait; no apparent clubbing or cyanosis in extremities Lymphatic:  No palpable cervical or axillary lymphadenopathy Skin:  Warm, dry; no sign of jaundice Psychiatric - alert and oriented x 4; calm mood and affect   Labs, Imaging and Diagnostic Testing: CLINICAL DATA:  Patient was recalled from screening mammogram for a possible asymmetry in the left breast. Strong family history of breast cancer. The patient's mother was diagnosed with breast cancer at the age of 65.   EXAM: DIGITAL DIAGNOSTIC UNILATERAL LEFT MAMMOGRAM WITH TOMOSYNTHESIS; ULTRASOUND LEFT BREAST LIMITED   TECHNIQUE: Left digital diagnostic mammography and breast tomosynthesis was performed.; Targeted ultrasound examination of the left breast was performed.   COMPARISON:  Previous exam(s).   ACR Breast Density Category b: There are scattered areas of fibroglandular density.   FINDINGS: Additional imaging of the left breast was performed. There is persistent asymmetry in the lower outer quadrant of the left breast seen on the cc view. There are no malignant type microcalcifications.   Targeted ultrasound is performed, showing normal tissue in the lower outer quadrant of the left breast. No solid or cystic mass, abnormal shadowing or distortion visualized. Sonographic evaluation the left axilla does not show any enlarged adenopathy.   IMPRESSION: Indeterminate asymmetry in the lower outer quadrant of the left breast.   RECOMMENDATION: Stereotactic biopsy of the asymmetry in the lower outer quadrant of the left breast is recommended.   I have discussed the  findings and recommendations with the patient. If applicable, a reminder letter will be sent to the patient regarding the next appointment.   BI-RADS CATEGORY  1: Negative.   Electronically Signed: By: Baird Lyons M.D. On: 09/21/2022 16:13  Diagnosis Breast, left, needle core biopsy - DUCTAL CARCINOMA IN SITU, LOW GRADE - NECROSIS: NOT IDENTIFIED - CALCIFICATIONS: NOT IDENTIFIED - DCIS LENGTH: 0.3 CM Diagnosis Note Dr. Kenyon Ana reviewed the case and concurs with the above diagnosis. A breast prognostic profile (ER and PR) is pending and will be reported in an addendum. [facility] was notified on 09/23/2022. Holley Bouche MD Pathologist, Electronic Signature (Case signed 09/23/2022)  PROGNOSTIC INDICATORS Results: IMMUNOHISTOCHEMICAL AND MORPHOMETRIC ANALYSIS PERFORMED MANUALLY Estrogen Receptor: 100%, POSITIVE, STRONG STAINING INTENSITY Progesterone Receptor: 95%, POSITIVE, STRONG STAINING INTENSITY REFERENCE RANGE ESTROGEN RECEPTOR NEGATIVE 0% POSITIVE =>1% REFERENCE RANGE PROGESTERONE RECEPTOR NEGATIVE 0% POSITIVE =>1% All controls stained appropriately Marlena Clipper MD Pathologist, Electronic Signature ( Signed 09/26/2022)  Assessment and Plan:  Diagnoses and all orders for this visit:  Intraductal carcinoma in situ of left breast -  Ambulatory Referral to Oncology-Medical -     Ambulatory Referral to Radiation Oncology    I had a long discussion with the patient and her friend about her disease.  We discussed the meaning of ductal carcinoma in situ.  We discussed treatment options.  The patient is a good candidate for breast conservation.  We will recommend left breast radioactive seed localized lumpectomy.The surgical procedure has been discussed with the patient.  Potential risks, benefits, alternative treatments, and expected outcomes have been explained.  All of the patient's questions at this time have been answered.  The  likelihood of reaching the patient's treatment goal is good.  The patient understand the proposed surgical procedure and wishes to proceed.  We will refer the patient to oncology and radiation oncology.  Surgery will likely be followed by radiation and antiestrogens.  Lissa Morales, MD  10/04/2022 5:57 PM

## 2022-10-05 ENCOUNTER — Other Ambulatory Visit: Payer: Self-pay | Admitting: Surgery

## 2022-10-05 DIAGNOSIS — D0512 Intraductal carcinoma in situ of left breast: Secondary | ICD-10-CM

## 2022-10-06 ENCOUNTER — Telehealth: Payer: Self-pay | Admitting: Hematology and Oncology

## 2022-10-06 ENCOUNTER — Other Ambulatory Visit: Payer: Self-pay

## 2022-10-06 ENCOUNTER — Encounter (HOSPITAL_BASED_OUTPATIENT_CLINIC_OR_DEPARTMENT_OTHER): Payer: Self-pay | Admitting: Surgery

## 2022-10-06 NOTE — Progress Notes (Signed)
New Breast Cancer Diagnosis: Left Breast LOQ  Did patient present with symptoms (if so, please note symptoms) or screening mammography?: Screening mammogram revealed possible asymmetry in the left breast lower outer quadrant.   Location and Extent of disease :left breast. Located in the lower outer quadrant, measured 0.3 cm in greatest dimension. Adenopathy no.  Histology per Pathology Report: grade 1, DCIS 09/22/2022  Receptor Status: ER(positive), PR (positive), Her2-neu (), Ki-(%)  Surgeon and surgical plan, if any:  Dr. Corliss Skains 10/04/2022 -Left breast Lumpectomy with radioactive seed localization 6/6/224   Medical oncologist, treatment if any:   Dr. Pamelia Hoit 10/24/2022   Family History of Breast/Ovarian/Prostate Cancer: Mother had breast cancer, 2 maternal aunts have breast cancer.  2 maternal cousins had breast cancer.  Lymphedema issues, if any:No      Pain issues, if any:  No    SAFETY ISSUES: Prior radiation? No Pacemaker/ICD? No Possible current pregnancy? Hysterectomy Is the patient on methotrexate? No  Current Complaints / other details:

## 2022-10-06 NOTE — Telephone Encounter (Signed)
scheduled per referral, pt has been called and confirmed date and time. Pt is aware of location and to arrive early for check in   

## 2022-10-07 ENCOUNTER — Encounter: Payer: Self-pay | Admitting: Radiation Oncology

## 2022-10-07 ENCOUNTER — Ambulatory Visit
Admission: RE | Admit: 2022-10-07 | Discharge: 2022-10-07 | Disposition: A | Discharge status: Home / Self Care | Payer: Medicare HMO | Source: Ambulatory Visit | Attending: Radiation Oncology | Admitting: Radiation Oncology

## 2022-10-07 ENCOUNTER — Encounter (HOSPITAL_BASED_OUTPATIENT_CLINIC_OR_DEPARTMENT_OTHER)
Admission: RE | Admit: 2022-10-07 | Discharge: 2022-10-07 | Disposition: A | Discharge status: Home / Self Care | Payer: Medicare HMO | Source: Ambulatory Visit | Attending: Surgery | Admitting: Surgery

## 2022-10-07 VITALS — BP 104/71 | HR 52 | Temp 98.2°F | Resp 20 | Ht 63.5 in | Wt 195.8 lb

## 2022-10-07 DIAGNOSIS — Z803 Family history of malignant neoplasm of breast: Secondary | ICD-10-CM | POA: Diagnosis not present

## 2022-10-07 DIAGNOSIS — Z170 Estrogen receptor positive status [ER+]: Secondary | ICD-10-CM | POA: Diagnosis not present

## 2022-10-07 DIAGNOSIS — C50512 Malignant neoplasm of lower-outer quadrant of left female breast: Secondary | ICD-10-CM | POA: Diagnosis present

## 2022-10-07 DIAGNOSIS — I10 Essential (primary) hypertension: Secondary | ICD-10-CM | POA: Insufficient documentation

## 2022-10-07 DIAGNOSIS — E7800 Pure hypercholesterolemia, unspecified: Secondary | ICD-10-CM | POA: Insufficient documentation

## 2022-10-07 DIAGNOSIS — Z01810 Encounter for preprocedural cardiovascular examination: Secondary | ICD-10-CM | POA: Insufficient documentation

## 2022-10-07 DIAGNOSIS — Z79899 Other long term (current) drug therapy: Secondary | ICD-10-CM | POA: Insufficient documentation

## 2022-10-07 DIAGNOSIS — Z87891 Personal history of nicotine dependence: Secondary | ICD-10-CM | POA: Diagnosis not present

## 2022-10-07 MED ORDER — CHLORHEXIDINE GLUCONATE CLOTH 2 % EX PADS: 6.0000 | MEDICATED_PAD | Freq: Once | CUTANEOUS | Status: DC

## 2022-10-07 NOTE — Progress Notes (Signed)
Radiation Oncology         [phone] ________________________________  Name: Amy Vincent        MRN: [id number]  Date of Service: 10/07/2022 DOB: May 13, 1955  WU:[id number], Lind Guest, MD  Manus Rudd, MD     REFERRING PHYSICIAN: Manus Rudd, MD   DIAGNOSIS: The encounter diagnosis was Malignant neoplasm of lower-outer quadrant of left breast of female, estrogen receptor positive (HCC).   HISTORY OF PRESENT ILLNESS: Amy Vincent is a 67 y.o. female seen at the request of Dr. Corliss Skains for newly diagnosed left breast cancer. Screening mammogram on 09/02/22 showed possible asymmetry in the left breast. Diagnostic mammogram and U/S on 09/21/22 revealed an indeterminate asymmetry in the lower outer quadrant of the left breast.   Patient proceeded with a biopsy of he left breast on 09/21/22 revealed low grade DCIS. Necrosis and calcifications were not identified. Prognostic indicators revealed estrogen receptor 100% positivity with strong staining intensity and progesterone 95% positivity with strong staining intensity.   She met with Dr. Corliss Skains on 10/04/22. He recommended a left breast radioactive seed localized lumpectomy. This is scheduled for 10/13/22. She is scheduled to meet with Dr. Pamelia Hoit on 10/24/22 to discus antiestrogen therapy.   Patient was kindly referred today to discuss radiation therapy.   She does have a strong family history of breast cancer. Her mother passed away at age 85 of breast cancer. She has multiple maternal aunts have been diagnosed with breast cancer. She does not remember ever receiving genetic testing.   PREVIOUS RADIATION THERAPY: No   PAST MEDICAL HISTORY:  Past Medical History:  Diagnosis Date   Breast cancer (HCC) 09/22/2022   High cholesterol    Hx of adenomatous polyp of rectum 07/07/2016   Hypertension        PAST SURGICAL HISTORY: Past Surgical History:  Procedure Laterality Date   ABDOMINAL HYSTERECTOMY  1999   BREAST BIOPSY Left 09/22/2022   MM  LT BREAST BX W LOC DEV 1ST LESION IMAGE BX SPEC STEREO GUIDE 09/22/2022 [facility]   COLONOSCOPY  2006   Dr. Mann/ Normal Colonoscopy   INCISION AND DRAINAGE     I & D of an abscess on back   left arm surgery  2004   orthopedic surgery left arm due to fracture as a child that healed improperly     FAMILY HISTORY:  Family History  Problem Relation Age of Onset   Cancer Mother 66       breast   Breast cancer Mother 59   Diabetes Father    Cancer Maternal Aunt 12       breast   Breast cancer Maternal Aunt    Cancer Maternal Aunt        stomach   Breast cancer Maternal Aunt    Hypertension Paternal Uncle    Cancer Paternal Uncle 66       colon cancer   Breast cancer Cousin    Breast cancer Cousin 32   Colon polyps Neg Hx      SOCIAL HISTORY:  reports that she quit smoking about 28 years ago. Her smoking use included cigarettes. She has a 15.00 pack-year smoking history. She has never used smokeless tobacco. She reports current alcohol use of about 5.0 standard drinks of alcohol per week. She reports that she does not use drugs.   ALLERGIES: Patient has no known allergies.   MEDICATIONS:  Current Outpatient Medications  Medication Sig Dispense Refill   amLODipine (NORVASC) 10 MG  tablet 10 mg.     atenolol (TENORMIN) 50 MG tablet Take 50 mg by mouth daily.     atorvastatin (LIPITOR) 20 MG tablet Take 20 mg by mouth daily.     DILT-XR 240 MG 24 hr capsule Take 240 mg by mouth daily at 12 noon.     HYDROcodone-acetaminophen (NORCO/VICODIN) 5-325 MG tablet as needed.     losartan-hydrochlorothiazide (HYZAAR) 100-25 MG tablet Take 1 tablet by mouth daily.     meloxicam (MOBIC) 7.5 MG tablet Take by mouth.     Current Facility-Administered Medications  Medication Dose Route Frequency Provider Last Rate Last Admin   0.9 %  sodium chloride infusion  500 mL Intravenous Continuous Iva Boop, MD       Facility-Administered Medications Ordered in Other Encounters   Medication Dose Route Frequency Provider Last Rate Last Admin   Chlorhexidine Gluconate Cloth 2 % PADS 6 each  6 each Topical Once Manus Rudd, MD       And   Chlorhexidine Gluconate Cloth 2 % PADS 6 each  6 each Topical Once Manus Rudd, MD         REVIEW OF SYSTEMS: On review of systems, the patient reports that she is doing well overall. No specific breast complaints were verbalized today.     PHYSICAL EXAM:  Wt Readings from Last 3 Encounters:  10/06/22 190 lb (86.2 kg)  10/07/22 195 lb 12.8 oz (88.8 kg)  02/28/20 186 lb (84.4 kg)   Temp Readings from Last 3 Encounters:  10/07/22 98.2 F (36.8 C)  02/24/18 97.8 F (36.6 C) (Oral)  02/12/17 98.2 F (36.8 C) (Oral)   BP Readings from Last 3 Encounters:  10/07/22 104/71  02/28/20 115/75  02/24/18 128/61   Pulse Readings from Last 3 Encounters:  10/07/22 (!) 52  02/28/20 65  02/24/18 (!) 50   Pain Assessment Pain Score: 0-No pain/10  In general this is a well appearing feamle in no acute distress. She's alert and oriented x4 and appropriate throughout the examination. Cardiopulmonary assessment is negative for acute distress and she exhibits normal effort.     ECOG = 0  0 - Asymptomatic (Fully active, able to carry on all predisease activities without restriction)  1 - Symptomatic but completely ambulatory (Restricted in physically strenuous activity but ambulatory and able to carry out work of a light or sedentary nature. For example, light housework, office work)  2 - Symptomatic, <50% in bed during the day (Ambulatory and capable of all self care but unable to carry out any work activities. Up and about more than 50% of waking hours)  3 - Symptomatic, >50% in bed, but not bedbound (Capable of only limited self-care, confined to bed or chair 50% or more of waking hours)  4 - Bedbound (Completely disabled. Cannot carry on any self-care. Totally confined to bed or chair)  5 - Death   Santiago Glad MM, Creech RH,  Tormey DC, et al. [phone]). "Toxicity and response criteria of the [facility]". Am. Evlyn Clines. Oncol. 5 (6): 649-55    LABORATORY DATA:  Lab Results  Component Value Date   WBC 7.5 02/24/2018   HGB 15.3 (H) 02/24/2018   HCT 46.5 (H) 02/24/2018   MCV 89.4 02/24/2018   PLT 257 02/24/2018   Lab Results  Component Value Date   NA 140 02/24/2018   K 3.8 02/24/2018   CL 106 02/24/2018   CO2 26 02/24/2018   Lab Results  Component Value Date  ALT 19 03/01/2016   AST 16 03/01/2016   ALKPHOS 106 03/01/2016   BILITOT 0.9 03/01/2016      RADIOGRAPHY: MM LT BREAST BX W LOC DEV 1ST LESION IMAGE BX SPEC STEREO GUIDE  Addendum Date: 10/04/2022   ADDENDUM REPORT: 10/04/2022 09:30 ADDENDUM: PATHOLOGY revealed: Site Breast, LEFT, needle core biopsy - DUCTAL CARCINOMA IN SITU, LOW GRADE - NECROSIS: NOT IDENTIFIED - CALCIFICATIONS: NOT IDENTIFIED - DCIS LENGTH: 0.3 CM Pathology results are CONCORDANT with imaging findings, per Dr. Annia Belt. Pathology results and recommendations below were discussed with patient by telephone on 09/23/2022. Patient reported biopsy site within normal limits with slight tenderness at the site. Post biopsy care instructions were reviewed, questions were answered and my direct phone number was provided to patient. Patient was instructed to call [facility] if any concerns or questions arise related to the biopsy. RECOMMENDATION: Surgical consultation has been arranged for patient to see [facility]. Pathology results reported by Lynett Grimes, RN on 09/23/2022. Electronically Signed   By: Annia Belt M.D.   On: 10/04/2022 09:30   Result Date: 10/04/2022 CLINICAL DATA:  Patient with indeterminate left breast asymmetry. EXAM: LEFT BREAST STEREOTACTIC CORE NEEDLE BIOPSY COMPARISON:  Previous exam(s). FINDINGS: The patient and I discussed the procedure of stereotactic-guided biopsy including benefits and alternatives. We  discussed the high likelihood of a successful procedure. We discussed the risks of the procedure including infection, bleeding, tissue injury, clip migration, and inadequate sampling. Informed written consent was given. The usual time out protocol was performed immediately prior to the procedure. Using sterile technique and 1% Lidocaine as local anesthetic, under stereotactic guidance, a 9 gauge vacuum assisted device was used to perform core needle biopsy of asymmetry lower outer left breast using a cranial approach. Lesion quadrant: Lower outer left breast At the conclusion of the procedure, coil shaped tissue marker clip was deployed into the biopsy cavity. Follow-up 2-view mammogram was performed and dictated separately. IMPRESSION: Stereotactic-guided biopsy of left breast asymmetry. No apparent complications. Electronically Signed: By: Annia Belt M.D. On: 09/22/2022 12:42  MM 3D DIAGNOSTIC MAMMOGRAM UNILATERAL LEFT BREAST  Addendum Date: 09/26/2022   ADDENDUM REPORT: 09/26/2022 08:34 ADDENDUM: This is an addendum to a diagnostic exam performed on 09/21/2022. BI-RADS CATEGORY: 4: Suspicious. Electronically Signed   By: Baird Lyons M.D.   On: 09/26/2022 08:34   Result Date: 09/26/2022 CLINICAL DATA:  Patient was recalled from screening mammogram for a possible asymmetry in the left breast. Strong family history of breast cancer. The patient's mother was diagnosed with breast cancer at the age of 15. EXAM: DIGITAL DIAGNOSTIC UNILATERAL LEFT MAMMOGRAM WITH TOMOSYNTHESIS; ULTRASOUND LEFT BREAST LIMITED TECHNIQUE: Left digital diagnostic mammography and breast tomosynthesis was performed.; Targeted ultrasound examination of the left breast was performed. COMPARISON:  Previous exam(s). ACR Breast Density Category b: There are scattered areas of fibroglandular density. FINDINGS: Additional imaging of the left breast was performed. There is persistent asymmetry in the lower outer quadrant of the left breast seen  on the cc view. There are no malignant type microcalcifications. Targeted ultrasound is performed, showing normal tissue in the lower outer quadrant of the left breast. No solid or cystic mass, abnormal shadowing or distortion visualized. Sonographic evaluation the left axilla does not show any enlarged adenopathy. IMPRESSION: Indeterminate asymmetry in the lower outer quadrant of the left breast. RECOMMENDATION: Stereotactic biopsy of the asymmetry in the lower outer quadrant of the left breast is recommended. I have discussed the findings and  recommendations with the patient. If applicable, a reminder letter will be sent to the patient regarding the next appointment. BI-RADS CATEGORY  1: Negative. Electronically Signed: By: Baird Lyons M.D. On: 09/21/2022 16:13  [place] LIMITED ULTRASOUND INCLUDING AXILLA LEFT BREAST   Addendum Date: 09/26/2022   ADDENDUM REPORT: 09/26/2022 08:34 ADDENDUM: This is an addendum to a diagnostic exam performed on 09/21/2022. BI-RADS CATEGORY: 4: Suspicious. Electronically Signed   By: Baird Lyons M.D.   On: 09/26/2022 08:34   Result Date: 09/26/2022 CLINICAL DATA:  Patient was recalled from screening mammogram for a possible asymmetry in the left breast. Strong family history of breast cancer. The patient's mother was diagnosed with breast cancer at the age of 55. EXAM: DIGITAL DIAGNOSTIC UNILATERAL LEFT MAMMOGRAM WITH TOMOSYNTHESIS; ULTRASOUND LEFT BREAST LIMITED TECHNIQUE: Left digital diagnostic mammography and breast tomosynthesis was performed.; Targeted ultrasound examination of the left breast was performed. COMPARISON:  Previous exam(s). ACR Breast Density Category b: There are scattered areas of fibroglandular density. FINDINGS: Additional imaging of the left breast was performed. There is persistent asymmetry in the lower outer quadrant of the left breast seen on the cc view. There are no malignant type microcalcifications. Targeted ultrasound is performed, showing normal  tissue in the lower outer quadrant of the left breast. No solid or cystic mass, abnormal shadowing or distortion visualized. Sonographic evaluation the left axilla does not show any enlarged adenopathy. IMPRESSION: Indeterminate asymmetry in the lower outer quadrant of the left breast. RECOMMENDATION: Stereotactic biopsy of the asymmetry in the lower outer quadrant of the left breast is recommended. I have discussed the findings and recommendations with the patient. If applicable, a reminder letter will be sent to the patient regarding the next appointment. BI-RADS CATEGORY  1: Negative. Electronically Signed: By: Baird Lyons M.D. On: 09/21/2022 16:13  MM CLIP PLACEMENT LEFT  Result Date: 09/22/2022 CLINICAL DATA:  Stereo biopsy left breast asymmetry EXAM: 3D DIAGNOSTIC LEFT MAMMOGRAM POST STEREOTACTIC BIOPSY COMPARISON:  Previous exam(s). FINDINGS: 3D Mammographic images were obtained following stereotactic guided biopsy of left breast asymmetry. The biopsy marking clip is in expected position at the site of biopsy. IMPRESSION: Appropriate positioning of the coil shaped biopsy marking clip at the site of biopsy in the lower outer left breast. Final Assessment: Post Procedure Mammograms for Marker Placement Electronically Signed   By: Annia Belt M.D.   On: 09/22/2022 12:44      IMPRESSION/PLAN: 1. 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 Dr. Mitzi Hansen discussed the pathology findings and reviewed the nature of early stage breast cancer and the role radiation plays in treatment Dr. Mitzi Hansen recommends external beam radiotherapy to the left breast following her lumpectomy to reduce risks of local recurrence followed by antiestrogen therapy. We discussed the risks, benefits, short, and long term effects of radiotherapy, as well as the curative intent, and the patient is interested in proceeding. Dr. Mitzi Hansen discussed the delivery and logistics of radiotherapy and anticipates a course of 4  weeks of radiotherapy to the left breast with deep inspiration breath hold technique. We will see her back a few weeks after surgery to discuss the simulation process and anticipate starting radiotherapy about 4-6 weeks after surgery.   2. Possible genetic predisposition to malignancy. The patient is a candidate for genetic testing given her personal and family history. Referral made to genetics today.   In a visit lasting 60 minutes, greater than 50% of the time was spent face to face discussing the patient's  condition, in preparation for the discussion, and coordinating the patient's care.   The above documentation reflects my direct findings during this shared patient visit. Please see the separate note by Dr. Mitzi Hansen on this date for the remainder of the patient's plan of care.    Joyice Faster, PA-C    **Disclaimer: This note was dictated with voice recognition software. Similar sounding words can inadvertently be transcribed and this note may contain transcription errors which may not have been corrected upon publication of note.**

## 2022-10-12 ENCOUNTER — Ambulatory Visit
Admission: RE | Admit: 2022-10-12 | Discharge: 2022-10-12 | Disposition: A | Discharge status: Home / Self Care | Payer: Medicare HMO | Source: Ambulatory Visit | Attending: Surgery | Admitting: Surgery

## 2022-10-12 DIAGNOSIS — D0512 Intraductal carcinoma in situ of left breast: Secondary | ICD-10-CM

## 2022-10-12 HISTORY — PX: BREAST BIOPSY: SHX20

## 2022-10-13 ENCOUNTER — Ambulatory Visit (HOSPITAL_BASED_OUTPATIENT_CLINIC_OR_DEPARTMENT_OTHER): Payer: Medicare HMO | Admitting: Certified Registered"

## 2022-10-13 ENCOUNTER — Ambulatory Visit
Admission: RE | Admit: 2022-10-13 | Discharge: 2022-10-13 | Disposition: A | Discharge status: Home / Self Care | Payer: Medicare HMO | Source: Ambulatory Visit | Attending: Surgery | Admitting: Surgery

## 2022-10-13 ENCOUNTER — Ambulatory Visit (HOSPITAL_BASED_OUTPATIENT_CLINIC_OR_DEPARTMENT_OTHER)
Admission: RE | Admit: 2022-10-13 | Discharge: 2022-10-13 | Disposition: A | Discharge status: Home / Self Care | Payer: Medicare HMO | Attending: Surgery | Admitting: Surgery

## 2022-10-13 ENCOUNTER — Encounter (HOSPITAL_BASED_OUTPATIENT_CLINIC_OR_DEPARTMENT_OTHER): Payer: Self-pay | Admitting: Surgery

## 2022-10-13 ENCOUNTER — Encounter (HOSPITAL_BASED_OUTPATIENT_CLINIC_OR_DEPARTMENT_OTHER)
Admission: RE | Disposition: A | Discharge status: Home / Self Care | Payer: Self-pay | Source: Home / Self Care | Attending: Surgery

## 2022-10-13 DIAGNOSIS — E669 Obesity, unspecified: Secondary | ICD-10-CM | POA: Diagnosis not present

## 2022-10-13 DIAGNOSIS — Z803 Family history of malignant neoplasm of breast: Secondary | ICD-10-CM | POA: Insufficient documentation

## 2022-10-13 DIAGNOSIS — Z87891 Personal history of nicotine dependence: Secondary | ICD-10-CM | POA: Insufficient documentation

## 2022-10-13 DIAGNOSIS — I10 Essential (primary) hypertension: Secondary | ICD-10-CM | POA: Diagnosis not present

## 2022-10-13 DIAGNOSIS — D0512 Intraductal carcinoma in situ of left breast: Secondary | ICD-10-CM

## 2022-10-13 DIAGNOSIS — Z01818 Encounter for other preprocedural examination: Secondary | ICD-10-CM

## 2022-10-13 DIAGNOSIS — Z6834 Body mass index (BMI) 34.0-34.9, adult: Secondary | ICD-10-CM

## 2022-10-13 DIAGNOSIS — Z170 Estrogen receptor positive status [ER+]: Secondary | ICD-10-CM | POA: Insufficient documentation

## 2022-10-13 HISTORY — PX: BREAST LUMPECTOMY WITH RADIOACTIVE SEED LOCALIZATION: SHX6424

## 2022-10-13 SURGERY — BREAST LUMPECTOMY WITH RADIOACTIVE SEED LOCALIZATION
Anesthesia: General | Site: Breast | Laterality: Left

## 2022-10-13 MED ORDER — PROPOFOL 500 MG/50ML IV EMUL
INTRAVENOUS | Status: DC | PRN
  Administered 2022-10-13: 35 ug/kg/min via INTRAVENOUS

## 2022-10-13 MED ORDER — CEFAZOLIN SODIUM-DEXTROSE 2-4 GM/100ML-% IV SOLN: 2.0000 g | INTRAVENOUS | Status: DC

## 2022-10-13 MED ORDER — DEXAMETHASONE SODIUM PHOSPHATE 10 MG/ML IJ SOLN
INTRAMUSCULAR | Status: DC | PRN
  Administered 2022-10-13: 5 mg via INTRAVENOUS

## 2022-10-13 MED ORDER — CEFAZOLIN SODIUM-DEXTROSE 2-4 GM/100ML-% IV SOLN
INTRAVENOUS | Status: AC
  Filled 2022-10-13: qty 100

## 2022-10-13 MED ORDER — FENTANYL CITRATE (PF) 100 MCG/2ML IJ SOLN
INTRAMUSCULAR | Status: DC | PRN
  Administered 2022-10-13: 100 ug via INTRAVENOUS

## 2022-10-13 MED ORDER — ONDANSETRON HCL 4 MG/2ML IJ SOLN
INTRAMUSCULAR | Status: DC | PRN
  Administered 2022-10-13: 4 mg via INTRAVENOUS

## 2022-10-13 MED ORDER — PROPOFOL 10 MG/ML IV BOLUS
INTRAVENOUS | Status: AC
  Filled 2022-10-13: qty 20

## 2022-10-13 MED ORDER — LACTATED RINGERS IV SOLN: INTRAVENOUS | Status: DC

## 2022-10-13 MED ORDER — BUPIVACAINE-EPINEPHRINE 0.25% -1:200000 IJ SOLN
INTRAMUSCULAR | Status: DC | PRN
  Administered 2022-10-13: 10 mL

## 2022-10-13 MED ORDER — MIDAZOLAM HCL 2 MG/2ML IJ SOLN
INTRAMUSCULAR | Status: AC
  Filled 2022-10-13: qty 2

## 2022-10-13 MED ORDER — MIDAZOLAM HCL 2 MG/2ML IJ SOLN
INTRAMUSCULAR | Status: DC | PRN
  Administered 2022-10-13: 2 mg via INTRAVENOUS

## 2022-10-13 MED ORDER — MEPERIDINE HCL 25 MG/ML IJ SOLN: 6.2500 mg | INTRAMUSCULAR | Status: DC | PRN

## 2022-10-13 MED ORDER — LACTATED RINGERS IV SOLN: INTRAVENOUS | Status: DC | PRN

## 2022-10-13 MED ORDER — ACETAMINOPHEN 500 MG PO TABS
ORAL_TABLET | ORAL | Status: AC
  Filled 2022-10-13: qty 2

## 2022-10-13 MED ORDER — PROMETHAZINE HCL 25 MG/ML IJ SOLN: 6.2500 mg | INTRAMUSCULAR | Status: DC | PRN

## 2022-10-13 MED ORDER — AMISULPRIDE (ANTIEMETIC) 5 MG/2ML IV SOLN: 10.0000 mg | Freq: Once | INTRAVENOUS | Status: DC | PRN

## 2022-10-13 MED ORDER — PROPOFOL 10 MG/ML IV BOLUS
INTRAVENOUS | Status: DC | PRN
  Administered 2022-10-13: 200 mg via INTRAVENOUS

## 2022-10-13 MED ORDER — FENTANYL CITRATE (PF) 100 MCG/2ML IJ SOLN
INTRAMUSCULAR | Status: AC
  Filled 2022-10-13: qty 2

## 2022-10-13 MED ORDER — HYDROMORPHONE HCL 1 MG/ML IJ SOLN: 0.2500 mg | INTRAMUSCULAR | Status: DC | PRN

## 2022-10-13 MED ORDER — ACETAMINOPHEN 500 MG PO TABS
1000.0000 mg | ORAL_TABLET | ORAL | Status: AC
  Administered 2022-10-13: 1000 mg via ORAL

## 2022-10-13 MED ORDER — EPHEDRINE SULFATE (PRESSORS) 50 MG/ML IJ SOLN
INTRAMUSCULAR | Status: DC | PRN
  Administered 2022-10-13 (×2): 10 mg via INTRAVENOUS

## 2022-10-13 MED ORDER — LIDOCAINE HCL (CARDIAC) PF 100 MG/5ML IV SOSY
PREFILLED_SYRINGE | INTRAVENOUS | Status: DC | PRN
  Administered 2022-10-13: 60 mg via INTRAVENOUS

## 2022-10-13 MED ORDER — OXYCODONE HCL 5 MG PO TABS: 5.0000 mg | ORAL_TABLET | Freq: Once | ORAL | Status: DC | PRN

## 2022-10-13 MED ORDER — DEXAMETHASONE SODIUM PHOSPHATE 10 MG/ML IJ SOLN
INTRAMUSCULAR | Status: AC
  Filled 2022-10-13: qty 1

## 2022-10-13 MED ORDER — ONDANSETRON HCL 4 MG/2ML IJ SOLN
INTRAMUSCULAR | Status: AC
  Filled 2022-10-13: qty 2

## 2022-10-13 MED ORDER — OXYCODONE HCL 5 MG/5ML PO SOLN: 5.0000 mg | Freq: Once | ORAL | Status: DC | PRN

## 2022-10-13 MED ORDER — DROPERIDOL 2.5 MG/ML IJ SOLN
INTRAMUSCULAR | Status: DC | PRN
  Administered 2022-10-13: .625 mg via INTRAVENOUS

## 2022-10-13 MED ORDER — CEFAZOLIN SODIUM-DEXTROSE 2-3 GM-%(50ML) IV SOLR
INTRAVENOUS | Status: DC | PRN
  Administered 2022-10-13: 2 g via INTRAVENOUS

## 2022-10-13 SURGICAL SUPPLY — 48 items
APL PRP STRL LF DISP 70% ISPRP (MISCELLANEOUS) ×1
APL SKNCLS STERI-STRIP NONHPOA (GAUZE/BANDAGES/DRESSINGS) ×1
APPLIER CLIP 9.375 MED OPEN (MISCELLANEOUS) ×1
APR CLP MED 9.3 20 MLT OPN (MISCELLANEOUS) ×1
BENZOIN TINCTURE PRP APPL 2/3 (GAUZE/BANDAGES/DRESSINGS) ×2 IMPLANT
BLADE HEX COATED 2.75 (ELECTRODE) ×2 IMPLANT
BLADE SURG 15 STRL LF DISP TIS (BLADE) ×2 IMPLANT
BLADE SURG 15 STRL SS (BLADE) ×1
CANISTER SUC SOCK COL 7IN (MISCELLANEOUS) IMPLANT
CANISTER SUCT 1200ML W/VALVE (MISCELLANEOUS) IMPLANT
CHLORAPREP W/TINT 26 (MISCELLANEOUS) ×2 IMPLANT
CLIP APPLIE 9.375 MED OPEN (MISCELLANEOUS) ×2 IMPLANT
COVER BACK TABLE 60X90IN (DRAPES) ×2 IMPLANT
COVER MAYO STAND STRL (DRAPES) ×2 IMPLANT
COVER PROBE CYLINDRICAL 5X96 (MISCELLANEOUS) ×2 IMPLANT
DRAPE LAPAROTOMY 100X72 PEDS (DRAPES) ×2 IMPLANT
DRAPE UTILITY XL STRL (DRAPES) ×2 IMPLANT
DRSG TEGADERM 4X4.75 (GAUZE/BANDAGES/DRESSINGS) ×2 IMPLANT
ELECT REM PT RETURN 9FT ADLT (ELECTROSURGICAL) ×1
ELECTRODE REM PT RTRN 9FT ADLT (ELECTROSURGICAL) ×2 IMPLANT
GAUZE SPONGE 2X2 STRL 8-PLY (GAUZE/BANDAGES/DRESSINGS) IMPLANT
GAUZE SPONGE 4X4 12PLY STRL LF (GAUZE/BANDAGES/DRESSINGS) IMPLANT
GLOVE BIO SURGEON STRL SZ 6.5 (GLOVE) IMPLANT
GLOVE BIO SURGEON STRL SZ7 (GLOVE) ×2 IMPLANT
GLOVE BIOGEL PI IND STRL 7.5 (GLOVE) ×2 IMPLANT
GOWN STRL REUS W/ TWL LRG LVL3 (GOWN DISPOSABLE) ×4 IMPLANT
GOWN STRL REUS W/TWL LRG LVL3 (GOWN DISPOSABLE) ×3
KIT MARKER MARGIN INK (KITS) ×2 IMPLANT
NDL HYPO 25X1 1.5 SAFETY (NEEDLE) ×2 IMPLANT
NEEDLE HYPO 25X1 1.5 SAFETY (NEEDLE) ×1 IMPLANT
NS IRRIG 1000ML POUR BTL (IV SOLUTION) ×2 IMPLANT
PACK BASIN DAY SURGERY FS (CUSTOM PROCEDURE TRAY) ×2 IMPLANT
PENCIL SMOKE EVACUATOR (MISCELLANEOUS) ×2 IMPLANT
SLEEVE SCD COMPRESS KNEE MED (STOCKING) ×2 IMPLANT
SPIKE FLUID TRANSFER (MISCELLANEOUS) IMPLANT
SPONGE T-LAP 18X18 ~~LOC~~+RFID (SPONGE) IMPLANT
SPONGE T-LAP 4X18 ~~LOC~~+RFID (SPONGE) ×2 IMPLANT
STRIP CLOSURE SKIN 1/2X4 (GAUZE/BANDAGES/DRESSINGS) ×2 IMPLANT
SUT MON AB 4-0 PC3 18 (SUTURE) ×2 IMPLANT
SUT SILK 2 0 SH (SUTURE) IMPLANT
SUT VIC AB 3-0 SH 27 (SUTURE) ×1
SUT VIC AB 3-0 SH 27X BRD (SUTURE) ×2 IMPLANT
SYR BULB EAR ULCER 3OZ GRN STR (SYRINGE) IMPLANT
SYR CONTROL 10ML LL (SYRINGE) ×2 IMPLANT
TOWEL GREEN STERILE FF (TOWEL DISPOSABLE) ×2 IMPLANT
TRAY FAXITRON CT DISP (TRAY / TRAY PROCEDURE) ×2 IMPLANT
TUBE CONNECTING 20X1/4 (TUBING) IMPLANT
YANKAUER SUCT BULB TIP NO VENT (SUCTIONS) IMPLANT

## 2022-10-13 NOTE — Discharge Instructions (Addendum)
[facility] Office Phone Number [phone]  BREAST BIOPSY/ PARTIAL MASTECTOMY: POST OP INSTRUCTIONS  Always review your discharge instruction sheet given to you by the facility where your surgery was performed.  IF YOU HAVE DISABILITY OR FAMILY LEAVE FORMS, YOU MUST BRING THEM TO THE OFFICE FOR PROCESSING.  DO NOT GIVE THEM TO YOUR DOCTOR.  A prescription for pain medication may be given to you upon discharge.  Take your pain medication as prescribed, if needed.  If narcotic pain medicine is not needed, then you may take acetaminophen (Tylenol) or ibuprofen (Advil) as needed. Take your usually prescribed medications unless otherwise directed If you need a refill on your pain medication, please contact your pharmacy.  They will contact our office to request authorization.  Prescriptions will not be filled after 5pm or on week-ends. You should eat very light the first 24 hours after surgery, such as soup, crackers, pudding, etc.  Resume your normal diet the day after surgery. Most patients will experience some swelling and bruising in the breast.  Ice packs and a good support bra will help.  Swelling and bruising can take several days to resolve.  It is common to experience some constipation if taking pain medication after surgery.  Increasing fluid intake and taking a stool softener will usually help or prevent this problem from occurring.  A mild laxative (Milk of Magnesia or Miralax) should be taken according to package directions if there are no bowel movements after 48 hours. Unless discharge instructions indicate otherwise, you may remove your bandages 24-48 hours after surgery, and you may shower at that time.  You may have steri-strips (small skin tapes) in place directly over the incision.  These strips should be left on the skin for 7-10 days.  If your surgeon used skin glue on the incision, you may shower in 24 hours.  The glue will flake off over the next 2-3 weeks.  Any  sutures or staples will be removed at the office during your follow-up visit. ACTIVITIES:  You may resume regular daily activities (gradually increasing) beginning the next day.  Wearing a good support bra or sports bra minimizes pain and swelling.  You may have sexual intercourse when it is comfortable. You may drive when you no longer are taking prescription pain medication, you can comfortably wear a seatbelt, and you can safely maneuver your car and apply brakes. RETURN TO WORK:  ______________________________________________________________________________________ Amy Vincent should see your doctor in the office for a follow-up appointment approximately two weeks after your surgery.  Your doctor's nurse will typically make your follow-up appointment when she calls you with your pathology report.  Expect your pathology report 2-3 business days after your surgery.  You may call to check if you do not hear from [place] after three days. OTHER INSTRUCTIONS: _______________________________________________________________________________________________ _____________________________________________________________________________________________________________________________________ _____________________________________________________________________________________________________________________________________ _____________________________________________________________________________________________________________________________________  WHEN TO CALL YOUR DOCTOR: Fever over 101.0 Nausea and/or vomiting. Extreme swelling or bruising. Continued bleeding from incision. Increased pain, redness, or drainage from the incision.  The clinic staff is available to answer your questions during regular business hours.  Please don't hesitate to call and ask to speak to one of the nurses for clinical concerns.  If you have a medical emergency, go to the nearest emergency room or call 911.  A surgeon from [facility] is always on call at the hospital.  For further questions, please visit [email or web address]   Post Anesthesia Home Care Instructions  Activity: Get plenty of rest for the remainder of the  day. A responsible individual must stay with you for 24 hours following the procedure.  For the next 24 hours, DO NOT: -Drive a car -Advertising copywriter -Drink alcoholic beverages -Take any medication unless instructed by your physician -Make any legal decisions or sign important papers.  Meals: Start with liquid foods such as gelatin or soup. Progress to regular foods as tolerated. Avoid greasy, spicy, heavy foods. If nausea and/or vomiting occur, drink only clear liquids until the nausea and/or vomiting subsides. Call your physician if vomiting continues.  Special Instructions/Symptoms: Your throat may feel dry or sore from the anesthesia or the breathing tube placed in your throat during surgery. If this causes discomfort, gargle with warm salt water. The discomfort should disappear within 24 hours.  If you had a scopolamine patch placed behind your ear for the management of post- operative nausea and/or vomiting:  1. The medication in the patch is effective for 72 hours, after which it should be removed.  Wrap patch in a tissue and discard in the trash. Wash hands thoroughly with soap and water. 2. You may remove the patch earlier than 72 hours if you experience unpleasant side effects which may include dry mouth, dizziness or visual disturbances. 3. Avoid touching the patch. Wash your hands with soap and water after contact with the patch.     May have Tylenol today after 1:00 PM

## 2022-10-13 NOTE — Anesthesia Preprocedure Evaluation (Addendum)
Anesthesia Evaluation  Patient identified by MRN, date of birth, ID band Patient awake    Reviewed: Allergy & Precautions, H&P , NPO status , Patient's Chart, lab work & pertinent test results  Airway Mallampati: II  TM Distance: >3 FB Neck ROM: Full    Dental  (+) Missing   Pulmonary former smoker   Pulmonary exam normal breath sounds clear to auscultation       Cardiovascular hypertension, Pt. on medications Normal cardiovascular exam Rhythm:Regular Rate:Normal     Neuro/Psych    Depression    negative neurological ROS  negative psych ROS   GI/Hepatic negative GI ROS, Neg liver ROS,,,  Endo/Other  negative endocrine ROS    Renal/GU negative Renal ROS  negative genitourinary   Musculoskeletal negative musculoskeletal ROS (+)    Abdominal  (+) + obese  Peds negative pediatric ROS (+)  Hematology negative hematology ROS (+)   Anesthesia Other Findings   Reproductive/Obstetrics negative OB ROS                             Anesthesia Physical Anesthesia Plan  ASA: 2  Anesthesia Plan: General   Post-op Pain Management: Tylenol PO (pre-op)*   Induction: Intravenous  PONV Risk Score and Plan: 3 and Ondansetron, Dexamethasone, Midazolam and Treatment may vary due to age or medical condition  Airway Management Planned: LMA  Additional Equipment:   Intra-op Plan:   Post-operative Plan: Extubation in OR  Informed Consent: I have reviewed the patients History and Physical, chart, labs and discussed the procedure including the risks, benefits and alternatives for the proposed anesthesia with the patient or authorized representative who has indicated his/her understanding and acceptance.     Dental advisory given  Plan Discussed with: CRNA  Anesthesia Plan Comments:        Anesthesia Quick Evaluation

## 2022-10-13 NOTE — Anesthesia Postprocedure Evaluation (Signed)
Anesthesia Post Note  Patient: Amy Vincent  Procedure(s) Performed: LEFT BREAST LUMPECTOMY WITH RADIOACTIVE SEED LOCALIZATION (Left: Breast)     Patient location during evaluation: PACU Anesthesia Type: General Level of consciousness: awake and alert Pain management: pain level controlled Vital Signs Assessment: post-procedure vital signs reviewed and stable Respiratory status: spontaneous breathing, nonlabored ventilation and respiratory function stable Cardiovascular status: blood pressure returned to baseline and stable Postop Assessment: no apparent nausea or vomiting Anesthetic complications: no   No notable events documented.  Last Vitals:  Vitals:   10/13/22 1115 10/13/22 1125  BP: (!) 97/57 99/65  Pulse: 67 66  Resp: 20 19  Temp:    SpO2: 99% 100%    Last Pain:  Vitals:   10/13/22 1100  TempSrc:   PainSc: 0-No pain                 Lowella Curb

## 2022-10-13 NOTE — Op Note (Signed)
Pre-op Diagnosis:  Left breast ductal carcinoma in situ Post-op Diagnosis: same Procedure:  Left breast radioactive seed localized lumpectomy Surgeon:  Nixon Sparr K. Anesthesia:  GEN - LMA Indications:  This is a 67 year old female who presents with a strong family history of breast cancer. Her mother passed away at age 38 of breast cancer. Almost all of her multiple maternal aunts have also been diagnosed with breast cancer. She does not remember any genetic testing. The patient has 1 sister with whom she shares a mother. No children. No previous breast problems. Recently she underwent routine screening mammogram that revealed possible asymmetry in the left lower outer quadrant. There was no sonographic correlate. She underwent stereotactic biopsy of this area on 09/22/2022. 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 We discussed her options and will proceed with breast conserving therapy.  A radioactive seed was placed yesterday by Radiology.  The mass is located deep in the breast in the lower outer quadrant.   Description of procedure: The patient is brought to the operating room placed in supine position on the operating room table. After an adequate level of general anesthesia was obtained, her left breast was prepped with ChloraPrep and draped in sterile fashion. A timeout was taken to ensure the proper patient and proper procedure. We interrogated the breast with the neoprobe. We made a circumareolar incision around the lower side of the nipple after infiltrating with 0.25% Marcaine. Dissection was carried down in the breast tissue with cautery. We used the neoprobe to guide [place] towards the radioactive seed deep in the breast. We excised an area of tissue around the radioactive seed 3 cm in diameter. The specimen was removed and was oriented with a paint kit. Specimen mammogram showed the radioactive seed as well as the biopsy clip within the specimen. This was  sent for pathologic examination. There is no residual radioactivity within the biopsy cavity. We inspected carefully for hemostasis. The wound was thoroughly irrigated. The wound was closed with a deep layer of 3-0 Vicryl and a subcuticular layer of 4-0 Monocryl. Benzoin Steri-Strips were applied. The patient was then extubated and brought to the recovery room in stable condition. All sponge, instrument, and needle counts are correct.  Wilmon Arms. Corliss Skains, MD, [facility]  General/ Trauma Surgery  10/13/2022 10:23 AM

## 2022-10-13 NOTE — Transfer of Care (Signed)
Immediate Anesthesia Transfer of Care Note  Patient: Amy Vincent  Procedure(s) Performed: LEFT BREAST LUMPECTOMY WITH RADIOACTIVE SEED LOCALIZATION (Left: Breast)  Patient Location: PACU  Anesthesia Type:General  Level of Consciousness: awake and patient cooperative  Airway & Oxygen Therapy: Patient Spontanous Breathing and Patient connected to face mask oxygen  Post-op Assessment: Report given to RN and Post -op Vital signs reviewed and stable  Post vital signs: Reviewed and stable  Last Vitals:  Vitals Value Taken Time  BP    Temp    Pulse 77 10/13/22 1029  Resp 10 10/13/22 1029  SpO2 100 % 10/13/22 1029  Vitals shown include unvalidated device data.  Last Pain:  Vitals:   10/13/22 0653  TempSrc: Temporal  PainSc: 0-No pain      Patients Stated Pain Goal: 3 (10/13/22 1610)  Complications: No notable events documented.

## 2022-10-13 NOTE — Anesthesia Procedure Notes (Signed)
Procedure Name: LMA Insertion Date/Time: 10/13/2022 10:30 AM  Performed by: Karen Kitchens, CRNAPre-anesthesia Checklist: Patient identified, Emergency Drugs available, Suction available and Patient being monitored Patient Re-evaluated:Patient Re-evaluated prior to induction Oxygen Delivery Method: Circle system utilized Preoxygenation: Pre-oxygenation with 100% oxygen Induction Type: IV induction Ventilation: Mask ventilation without difficulty LMA: LMA inserted LMA Size: 4.0 Number of attempts: 1 Airway Equipment and Method: Bite block Placement Confirmation: positive ETCO2 Tube secured with: Tape Dental Injury: Teeth and Oropharynx as per pre-operative assessment

## 2022-10-13 NOTE — Interval H&P Note (Signed)
History and Physical Interval Note:  10/13/2022 7:10 AM  [name]  has presented today for surgery, with the diagnosis of LEFT BREAST DCIS.  The various methods of treatment have been discussed with the patient and family. After consideration of risks, benefits and other options for treatment, the patient has consented to  Procedure(s): LEFT BREAST LUMPECTOMY WITH RADIOACTIVE SEED LOCALIZATION (Left) as a surgical intervention.  The patient's history has been reviewed, patient examined, no change in status, stable for surgery.  I have reviewed the patient's chart and labs.  Questions were answered to the patient's satisfaction.     Amy Vincent

## 2022-10-14 LAB — SURGICAL PATHOLOGY

## 2022-10-15 ENCOUNTER — Encounter (HOSPITAL_BASED_OUTPATIENT_CLINIC_OR_DEPARTMENT_OTHER): Payer: Self-pay | Admitting: Surgery

## 2022-10-15 ENCOUNTER — Other Ambulatory Visit: Payer: Self-pay

## 2022-10-24 ENCOUNTER — Inpatient Hospital Stay: Payer: Medicare HMO

## 2022-10-24 ENCOUNTER — Other Ambulatory Visit: Payer: Self-pay | Admitting: *Deleted

## 2022-10-24 ENCOUNTER — Other Ambulatory Visit: Payer: Self-pay

## 2022-10-24 ENCOUNTER — Inpatient Hospital Stay: Payer: Medicare HMO | Attending: Hematology and Oncology | Admitting: Hematology and Oncology

## 2022-10-24 ENCOUNTER — Encounter: Payer: Self-pay | Admitting: *Deleted

## 2022-10-24 VITALS — BP 109/72 | HR 68 | Temp 97.7°F | Resp 18 | Ht 63.5 in | Wt 194.3 lb

## 2022-10-24 DIAGNOSIS — Z87891 Personal history of nicotine dependence: Secondary | ICD-10-CM | POA: Diagnosis not present

## 2022-10-24 DIAGNOSIS — Z803 Family history of malignant neoplasm of breast: Secondary | ICD-10-CM | POA: Diagnosis not present

## 2022-10-24 DIAGNOSIS — D0512 Intraductal carcinoma in situ of left breast: Secondary | ICD-10-CM | POA: Diagnosis present

## 2022-10-24 DIAGNOSIS — Z79899 Other long term (current) drug therapy: Secondary | ICD-10-CM | POA: Diagnosis not present

## 2022-10-24 DIAGNOSIS — Z800 Family history of malignant neoplasm of digestive organs: Secondary | ICD-10-CM

## 2022-10-24 DIAGNOSIS — Z170 Estrogen receptor positive status [ER+]: Secondary | ICD-10-CM | POA: Diagnosis not present

## 2022-10-24 NOTE — Assessment & Plan Note (Addendum)
09/27/2022: Screening mammogram detected left breast asymmetry, biopsy revealed low-grade DCIS that was ER/PR positive 10/13/2022: Left lumpectomy: DCIS solid type intermediate grade without necrosis, 1 cm, margins negative, ER 100%, PR 95%  Pathology review: I discussed with the patient the difference between DCIS and invasive breast cancer. It is considered a precancerous lesion. DCIS is classified as stage 0. It is generally detected through mammograms as calcifications. We discussed the significance of grades and its impact on prognosis. We also discussed the importance of ER and PR receptors and their implications to adjuvant treatment options. Prognosis of DCIS dependence on grade, comedo necrosis. It is anticipated that if not treated, 20-30% of DCIS can develop into invasive breast cancer.  Recommendation: 1. Adjuvant radiation therapy 3. Followed by antiestrogen therapy with tamoxifen 5 years  Tamoxifen counseling: We discussed the risks and benefits of tamoxifen. These include but not limited to insomnia, hot flashes, mood changes, vaginal dryness, and weight gain. Although rare, serious side effects including endometrial cancer, risk of blood clots were also discussed. We strongly believe that the benefits far outweigh the risks. Patient understands these risks and consented to starting treatment. Planned treatment duration is 5 years.  Return to clinic after radiation to start antiestrogen therapy

## 2022-10-24 NOTE — Progress Notes (Signed)
[facility] CONSULT NOTE  Patient Care Team: Gwenyth Bender, MD as PCP - General (Internal Medicine) Mirna Mires, MD as Consulting Physician (Family Medicine)  CHIEF COMPLAINTS/PURPOSE OF CONSULTATION:  Newly diagnosed left breast DCIS  HISTORY OF PRESENTING ILLNESS:  Amy Vincent 67 y.o. female is here because of recent diagnosis of left breast DCIS.  Patient routine screening mammogram detected asymmetry in the left breast which underwent a stereotactic biopsy revealed low-grade DCIS that was ER/PR positive.  She underwent left lumpectomy on 10/13/2022 with Dr. Corliss Skains that revealed intermediate grade DCIS 1 cm in size margins were negative and ER/PR were positive.  She was referred to [place] for discussion regarding adjuvant treatment options.  I reviewed her records extensively and collaborated the history with the patient.  SUMMARY OF ONCOLOGIC HISTORY: Oncology History  Ductal carcinoma in situ (DCIS) of left breast  09/27/2022 Initial Diagnosis   Screening mammogram detected left breast asymmetry, no ultrasound correlate, stereotactic biopsy: Low-grade DCIS ER 100%, PR 95%   10/13/2022 Surgery   Left lumpectomy: DCIS solid type intermediate grade without necrosis, 1 cm, margins negative, ER 100%, PR 95%      MEDICAL HISTORY:  Past Medical History:  Diagnosis Date   Breast cancer (HCC) 09/22/2022   High cholesterol    Hx of adenomatous polyp of rectum 07/07/2016   Hypertension     SURGICAL HISTORY: Past Surgical History:  Procedure Laterality Date   ABDOMINAL HYSTERECTOMY  1999   BREAST BIOPSY Left 09/22/2022   MM LT BREAST BX W LOC DEV 1ST LESION IMAGE BX SPEC STEREO GUIDE 09/22/2022 [facility]   BREAST BIOPSY  10/12/2022   MM LT RADIOACTIVE SEED LOC MAMMO GUIDE 10/12/2022 [facility]   BREAST LUMPECTOMY WITH RADIOACTIVE SEED LOCALIZATION Left 10/13/2022   Procedure: LEFT BREAST LUMPECTOMY WITH RADIOACTIVE SEED LOCALIZATION;  Surgeon: Manus Rudd,  MD;  Location:  SURGERY CENTER;  Service: General;  Laterality: Left;   COLONOSCOPY  2006   Dr. Mann/ Normal Colonoscopy   INCISION AND DRAINAGE     I & D of an abscess on back   left arm surgery  2004   orthopedic surgery left arm due to fracture as a child that healed improperly    SOCIAL HISTORY: Social History   Socioeconomic History   Marital status: Significant Other    Spouse name: Not on file   Number of children: 0   Years of education: Not on file   Highest education level: Not on file  Occupational History   Not on file  Tobacco Use   Smoking status: Former    Packs/day: 1.00    Years: 15.00    Additional pack years: 0.00    Total pack years: 15.00    Types: Cigarettes    Quit date: 05/09/1994    Years since quitting: 28.4   Smokeless tobacco: Never  Vaping Use   Vaping Use: Never used  Substance and Sexual Activity   Alcohol use: Yes    Alcohol/week: 5.0 standard drinks of alcohol    Types: 2 Shots of liquor, 3 Standard drinks or equivalent per week    Comment: 2 a week   Drug use: No   Sexual activity: Not on file  Other Topics Concern   Not on file  Social History Narrative   No children   On disability   Originally from [place]   Single, sister and brother liv   Lives with girlfriend    Enjoys gardening, antique "  flips"   Social Determinants of Health   Financial Resource Strain: Not on file  Food Insecurity: No Food Insecurity (10/07/2022)   Hunger Vital Sign    Worried About Running Out of Food in the Last Year: Never true    Ran Out of Food in the Last Year: Never true  Transportation Needs: No Transportation Needs (10/07/2022)   PRAPARE - Administrator, Civil Service (Medical): No    Lack of Transportation (Non-Medical): No  Physical Activity: Not on file  Stress: Not on file  Social Connections: Not on file  Intimate Partner Violence: Not At Risk (10/07/2022)   Humiliation, Afraid, Rape, and Kick questionnaire    Fear of  Current or Ex-Partner: No    Emotionally Abused: No    Physically Abused: No    Sexually Abused: No    FAMILY HISTORY: Family History  Problem Relation Age of Onset   Cancer Mother 67       breast   Breast cancer Mother 75   Diabetes Father    Cancer Maternal Aunt 53       breast   Breast cancer Maternal Aunt    Cancer Maternal Aunt        stomach   Breast cancer Maternal Aunt    Hypertension Paternal Uncle    Cancer Paternal Uncle 81       colon cancer   Breast cancer Cousin    Breast cancer Cousin 32   Colon polyps Neg Hx     ALLERGIES:  has No Known Allergies.  MEDICATIONS:  Current Outpatient Medications  Medication Sig Dispense Refill   amLODipine (NORVASC) 10 MG tablet 10 mg.     atenolol (TENORMIN) 50 MG tablet Take 50 mg by mouth daily.     atorvastatin (LIPITOR) 20 MG tablet Take 20 mg by mouth daily.     DILT-XR 240 MG 24 hr capsule Take 240 mg by mouth daily at 12 noon.     HYDROcodone-acetaminophen (NORCO/VICODIN) 5-325 MG tablet as needed.     losartan-hydrochlorothiazide (HYZAAR) 100-25 MG tablet Take 1 tablet by mouth daily.     meloxicam (MOBIC) 7.5 MG tablet Take by mouth.     Current Facility-Administered Medications  Medication Dose Route Frequency Provider Last Rate Last Admin   0.9 %  sodium chloride infusion  500 mL Intravenous Continuous Iva Boop, MD        REVIEW OF SYSTEMS:   Constitutional: Denies fevers, chills or abnormal night sweats Breast: Denies any pain or discomfort. All other systems were reviewed with the patient and are negative.  PHYSICAL EXAMINATION: ECOG PERFORMANCE STATUS: 0 - Asymptomatic  Vitals:   10/24/22 1544  BP: 109/72  Pulse: 68  Resp: 18  Temp: 97.7 F (36.5 C)  SpO2: 100%   Filed Weights   10/24/22 1544  Weight: 194 lb 4.8 oz (88.1 kg)    GENERAL:alert, no distress and comfortable    LABORATORY DATA:  I have reviewed the data as listed Lab Results  Component Value Date   WBC 7.5  02/24/2018   HGB 15.3 (H) 02/24/2018   HCT 46.5 (H) 02/24/2018   MCV 89.4 02/24/2018   PLT 257 02/24/2018   Lab Results  Component Value Date   NA 140 02/24/2018   K 3.8 02/24/2018   CL 106 02/24/2018   CO2 26 02/24/2018    RADIOGRAPHIC STUDIES: I have personally reviewed the radiological reports and agreed with the findings in the report.  ASSESSMENT  AND PLAN:  Ductal carcinoma in situ (DCIS) of left breast 09/27/2022: Screening mammogram detected left breast asymmetry, biopsy revealed low-grade DCIS that was ER/PR positive 10/13/2022: Left lumpectomy: DCIS solid type intermediate grade without necrosis, 1 cm, margins negative, ER 100%, PR 95%  Pathology review: I discussed with the patient the difference between DCIS and invasive breast cancer. It is considered a precancerous lesion. DCIS is classified as stage 0. It is generally detected through mammograms as calcifications. We discussed the significance of grades and its impact on prognosis. We also discussed the importance of ER and PR receptors and their implications to adjuvant treatment options. Prognosis of DCIS dependence on grade, comedo necrosis. It is anticipated that if not treated, 20-30% of DCIS can develop into invasive breast cancer.  Recommendation: 1. Adjuvant radiation therapy 3. Followed by antiestrogen therapy with tamoxifen 5 years  Tamoxifen counseling: We discussed the risks and benefits of tamoxifen. These include but not limited to insomnia, hot flashes, mood changes, vaginal dryness, and weight gain. Although rare, serious side effects including endometrial cancer, risk of blood clots were also discussed. We strongly believe that the benefits far outweigh the risks. Patient understands these risks and consented to starting treatment. Planned treatment duration is 5 years.  Because of her extensive family history we will request genetics consultation. Return to clinic after radiation to start antiestrogen  therapy  All questions were answered. The patient knows to call the clinic with any problems, questions or concerns.    Tamsen Meek, MD 10/24/22

## 2022-10-25 ENCOUNTER — Telehealth: Payer: Self-pay | Admitting: Radiation Oncology

## 2022-10-25 NOTE — Telephone Encounter (Signed)
LVM to schedule FUN/SIM with Dr. Mitzi Hansen PA Julien Girt

## 2022-10-27 ENCOUNTER — Encounter: Payer: Self-pay | Admitting: *Deleted

## 2022-11-02 ENCOUNTER — Telehealth: Payer: Self-pay | Admitting: Internal Medicine

## 2022-11-02 NOTE — Telephone Encounter (Signed)
Follow up call: Called twice and left a message regarding Genetics appointment time/dates.

## 2022-11-09 ENCOUNTER — Other Ambulatory Visit: Payer: Self-pay

## 2022-11-09 ENCOUNTER — Ambulatory Visit
Admission: RE | Admit: 2022-11-09 | Discharge: 2022-11-09 | Disposition: A | Discharge status: Home / Self Care | Payer: Medicare HMO | Source: Ambulatory Visit | Attending: Radiation Oncology | Admitting: Radiation Oncology

## 2022-11-09 ENCOUNTER — Encounter: Payer: Self-pay | Admitting: Radiation Oncology

## 2022-11-09 ENCOUNTER — Other Ambulatory Visit: Payer: Self-pay | Admitting: Radiation Oncology

## 2022-11-09 VITALS — BP 107/70 | HR 58 | Temp 97.6°F | Resp 20 | Ht 63.0 in | Wt 196.4 lb

## 2022-11-09 DIAGNOSIS — D0512 Intraductal carcinoma in situ of left breast: Secondary | ICD-10-CM

## 2022-11-09 DIAGNOSIS — Z170 Estrogen receptor positive status [ER+]: Secondary | ICD-10-CM | POA: Diagnosis not present

## 2022-11-09 DIAGNOSIS — Z79899 Other long term (current) drug therapy: Secondary | ICD-10-CM | POA: Insufficient documentation

## 2022-11-09 DIAGNOSIS — Z803 Family history of malignant neoplasm of breast: Secondary | ICD-10-CM | POA: Diagnosis not present

## 2022-11-09 DIAGNOSIS — E7800 Pure hypercholesterolemia, unspecified: Secondary | ICD-10-CM | POA: Insufficient documentation

## 2022-11-09 DIAGNOSIS — I10 Essential (primary) hypertension: Secondary | ICD-10-CM | POA: Insufficient documentation

## 2022-11-09 DIAGNOSIS — Z800 Family history of malignant neoplasm of digestive organs: Secondary | ICD-10-CM | POA: Insufficient documentation

## 2022-11-09 DIAGNOSIS — Z510 Encounter for antineoplastic radiation therapy: Secondary | ICD-10-CM | POA: Insufficient documentation

## 2022-11-09 DIAGNOSIS — Z87891 Personal history of nicotine dependence: Secondary | ICD-10-CM | POA: Insufficient documentation

## 2022-11-09 DIAGNOSIS — C50512 Malignant neoplasm of lower-outer quadrant of left female breast: Secondary | ICD-10-CM | POA: Insufficient documentation

## 2022-11-09 NOTE — Progress Notes (Signed)
Radiation Oncology         [phone] ________________________________  Name: Amy Vincent        MRN: [id number]  Date of Service: 11/09/2022 DOB: 03/14/1956  YN:WGNF, Lind Guest, MD  Serena Croissant, MD     REFERRING PHYSICIAN: Serena Croissant, MD   DIAGNOSIS: The primary encounter diagnosis was Ductal carcinoma in situ (DCIS) of left breast. A diagnosis of Malignant neoplasm of lower-outer quadrant of left breast of female, estrogen receptor positive (HCC) was also pertinent to this visit.   HISTORY OF PRESENT ILLNESS: Amy Vincent is a 67 y.o. female with a newly diagnosed left breast cancer. Screening mammogram on 09/02/22 showed possible asymmetry in the left breast. Diagnostic mammogram and U/S on 09/21/22 revealed an indeterminate asymmetry in the lower outer quadrant of the left breast. A biopsy on 09/21/22 revealed low grade DCIS.  And her tumor was ER/PR positive.  Since her last visit, the patient underwent a left breast lumpectomy with Dr. Corliss Skains on 10/13/2022 which revealed a intermediate grade DCIS that measured 1 cm in greatest dimension.  All margins were negative, the closest being the medial margin at 2 mm.  She is seen to discuss adjuvant radiotherapy.  PREVIOUS RADIATION THERAPY: No   PAST MEDICAL HISTORY:  Past Medical History:  Diagnosis Date   Breast cancer (HCC) 09/22/2022   High cholesterol    Hx of adenomatous polyp of rectum 07/07/2016   Hypertension        PAST SURGICAL HISTORY: Past Surgical History:  Procedure Laterality Date   ABDOMINAL HYSTERECTOMY  1999   BREAST BIOPSY Left 09/22/2022   MM LT BREAST BX W LOC DEV 1ST LESION IMAGE BX SPEC STEREO GUIDE 09/22/2022 [facility]   BREAST BIOPSY  10/12/2022   MM LT RADIOACTIVE SEED LOC MAMMO GUIDE 10/12/2022 [facility]   BREAST LUMPECTOMY WITH RADIOACTIVE SEED LOCALIZATION Left 10/13/2022   Procedure: LEFT BREAST LUMPECTOMY WITH RADIOACTIVE SEED LOCALIZATION;  Surgeon: Manus Rudd, MD;  Location:  [facility];  Service: General;  Laterality: Left;   COLONOSCOPY  2006   Dr. Mann/ Normal Colonoscopy   INCISION AND DRAINAGE     I & D of an abscess on back   left arm surgery  2004   orthopedic surgery left arm due to fracture as a child that healed improperly     FAMILY HISTORY:  Family History  Problem Relation Age of Onset   Breast cancer Mother 7   Diabetes Father    Breast cancer Maternal Aunt    Cancer Maternal Aunt        stomach   Breast cancer Maternal Aunt    Breast cancer Maternal Aunt    Breast cancer Maternal Aunt    Breast cancer Maternal Aunt    Breast cancer Maternal Aunt    Breast cancer Maternal Aunt    Hypertension Paternal Uncle    Cancer Paternal Uncle 45       colon cancer   Breast cancer Cousin    Breast cancer Cousin 22       maternal second cousin with triple negative breast cancer   Colon polyps Neg Hx      SOCIAL HISTORY:  reports that she quit smoking about 28 years ago. Her smoking use included cigarettes. She has a 15.00 pack-year smoking history. She has never used smokeless tobacco. She reports current alcohol use of about 5.0 standard drinks of alcohol per week. She reports that she does not use drugs.  The patient is single but in a relationship.  She lives in [place].   ALLERGIES: Patient has no known allergies.   MEDICATIONS:  Current Outpatient Medications  Medication Sig Dispense Refill   amLODipine (NORVASC) 10 MG tablet 10 mg.     atenolol (TENORMIN) 50 MG tablet Take 50 mg by mouth daily.     atorvastatin (LIPITOR) 20 MG tablet Take 20 mg by mouth daily.     DILT-XR 240 MG 24 hr capsule Take 240 mg by mouth daily at 12 noon.     HYDROcodone-acetaminophen (NORCO/VICODIN) 5-325 MG tablet as needed.     losartan-hydrochlorothiazide (HYZAAR) 100-25 MG tablet Take 1 tablet by mouth daily.     meloxicam (MOBIC) 7.5 MG tablet Take by mouth.     Current Facility-Administered Medications  Medication Dose Route  Frequency Provider Last Rate Last Admin   0.9 %  sodium chloride infusion  500 mL Intravenous Continuous Iva Boop, MD         REVIEW OF SYSTEMS: On review of systems, the patient reports that she is doing very well. She reports she's very pleased with her healing. No other breast specific complaints are verbalized.      PHYSICAL EXAM:  Wt Readings from Last 3 Encounters:  11/09/22 196 lb 6.4 oz (89.1 kg)  10/24/22 194 lb 4.8 oz (88.1 kg)  10/13/22 195 lb 1.7 oz (88.5 kg)   Temp Readings from Last 3 Encounters:  11/09/22 97.6 F (36.4 C) (Temporal)  10/24/22 97.7 F (36.5 C) (Temporal)  10/13/22 (!) 97.2 F (36.2 C)   BP Readings from Last 3 Encounters:  11/09/22 107/70  10/24/22 109/72  10/13/22 106/72   Pulse Readings from Last 3 Encounters:  11/09/22 (!) 58  10/24/22 68  10/13/22 68   Pain Assessment Pain Score: 0-No pain/10  In general this is a well appearing feamle in no acute distress. She's alert and oriented x4 and appropriate throughout the examination. Cardiopulmonary assessment is negative for acute distress and she exhibits normal effort.  Her left breast reveals a well-healed surgical incision site without erythema, separation, or drainage.   ECOG = 0  0 - Asymptomatic (Fully active, able to carry on all predisease activities without restriction)  1 - Symptomatic but completely ambulatory (Restricted in physically strenuous activity but ambulatory and able to carry out work of a light or sedentary nature. For example, light housework, office work)  2 - Symptomatic, <50% in bed during the day (Ambulatory and capable of all self care but unable to carry out any work activities. Up and about more than 50% of waking hours)  3 - Symptomatic, >50% in bed, but not bedbound (Capable of only limited self-care, confined to bed or chair 50% or more of waking hours)  4 - Bedbound (Completely disabled. Cannot carry on any self-care. Totally confined to bed or  chair)  5 - Death   Santiago Glad MM, Creech RH, Tormey DC, et al. [phone]). "Toxicity and response criteria of the [facility]". Am. Evlyn Clines. Oncol. 5 (6): 649-55    LABORATORY DATA:  Lab Results  Component Value Date   WBC 7.5 02/24/2018   HGB 15.3 (H) 02/24/2018   HCT 46.5 (H) 02/24/2018   MCV 89.4 02/24/2018   PLT 257 02/24/2018   Lab Results  Component Value Date   NA 140 02/24/2018   K 3.8 02/24/2018   CL 106 02/24/2018   CO2 26 02/24/2018   Lab Results  Component Value Date  ALT 19 03/01/2016   AST 16 03/01/2016   ALKPHOS 106 03/01/2016   BILITOT 0.9 03/01/2016      RADIOGRAPHY: MM Breast Surgical Specimen  Result Date: 10/13/2022 CLINICAL DATA:  Status post left lumpectomy. EXAM: SPECIMEN RADIOGRAPH OF THE LEFT BREAST COMPARISON:  Previous exam(s). FINDINGS: Status post excision of the left breast. The radioactive seed and biopsy marker clip are present, completely intact, and were marked for pathology. IMPRESSION: Specimen radiograph of the left breast. Electronically Signed   By: Annia Belt M.D.   On: 10/13/2022 10:12  MM LT RADIOACTIVE SEED LOC MAMMO GUIDE  Result Date: 10/12/2022 CLINICAL DATA:  Localization prior to surgery EXAM: MAMMOGRAPHIC GUIDED RADIOACTIVE SEED LOCALIZATION OF THE LEFT BREAST COMPARISON:  Previous exam(s). FINDINGS: Patient presents for radioactive seed localization prior to surgery. I met with the patient and we discussed the procedure of seed localization including benefits and alternatives. We discussed the high likelihood of a successful procedure. We discussed the risks of the procedure including infection, bleeding, tissue injury and further surgery. We discussed the low dose of radioactivity involved in the procedure. Informed, written consent was given. The usual time-out protocol was performed immediately prior to the procedure. Using mammographic guidance, sterile technique, 1% lidocaine and an I-125 radioactive seed, the  biopsy clip was localized using a lateral approach. The follow-up mammogram images confirm the seed in the expected location and were marked for the surgeon. Follow-up survey of the patient confirms presence of the radioactive seed. Order number of I-125 seed:  [id number]. Total activity:  0.242 millicuries reference Date: August 22, 2022 The patient tolerated the procedure well and was [facility]. She was given instructions regarding seed removal. IMPRESSION: Radioactive seed localization left breast. No apparent complications. Electronically Signed   By: Gerome Sam III M.D.   On: 10/12/2022 15:26      IMPRESSION/PLAN: 1. Intermediate Grade, ER/PR positive DCIS of the left breast. Dr. Mitzi Hansen has reviewed her final pathology findings and today we discussed the nature of noninvasive breast cancer. Dr. Mitzi Hansen recommends external beam radiotherapy to the left breast to reduce risks of local recurrence followed by antiestrogen therapy. We discussed the risks, benefits, short, and long term effects of radiotherapy, as well as the curative intent, and the patient is interested in proceeding.  We reviewed the delivery and logistics of radiotherapy and a Dr. Mitzi Hansen recommends 4 weeks of radiotherapy to the left breast with deep inspiration breath hold technique. Written consent is obtained and placed in the chart, a copy was provided to the patient.  She will simulate today. 2. Possible genetic predisposition to malignancy. The patient is a candidate for genetic testing given her personal and family history. She was offered referral and agreed. She will be contacted to coordinate testing.     In a visit lasting 45 minutes, greater than 50% of the time was spent face to face discussing the patient's condition, in preparation for the discussion, and coordinating the patient's care.     Osker Mason, [facility]   **Disclaimer: This note was dictated with voice recognition software. Similar  sounding words can inadvertently be transcribed and this note may contain transcription errors which may not have been corrected upon publication of note.**

## 2022-11-09 NOTE — Progress Notes (Signed)
Nursing interview for Ductal carcinoma in situ (DCIS) of left breast. Patient identity verified x2.  Patient reports doing well. No LT breast issues conveyed at this time.  Meaningful use complete. Hysterectomy  Vitals- BP 107/70 (BP Location: Right Arm, Patient Position: Sitting, Cuff Size: Normal)   Pulse (!) 58   Temp 97.6 F (36.4 C) (Temporal)   Resp 20   Ht 5\' 3"  (1.6 m)   Wt 196 lb 6.4 oz (89.1 kg)   SpO2 97%   BMI 34.79 kg/m    This concludes the interaction.  Ruel Favors, LPN

## 2022-11-15 ENCOUNTER — Telehealth: Payer: Self-pay | Admitting: Hematology and Oncology

## 2022-11-15 ENCOUNTER — Encounter: Payer: Self-pay | Admitting: *Deleted

## 2022-11-15 DIAGNOSIS — D0512 Intraductal carcinoma in situ of left breast: Secondary | ICD-10-CM

## 2022-11-15 NOTE — Telephone Encounter (Signed)
Scheduled appointment per scheduling message. Left voicemail.  

## 2022-11-22 DIAGNOSIS — Z510 Encounter for antineoplastic radiation therapy: Secondary | ICD-10-CM | POA: Diagnosis not present

## 2022-11-23 ENCOUNTER — Other Ambulatory Visit: Payer: Self-pay

## 2022-11-23 ENCOUNTER — Ambulatory Visit
Admission: RE | Admit: 2022-11-23 | Discharge: 2022-11-23 | Disposition: A | Discharge status: Home / Self Care | Payer: Medicare HMO | Source: Ambulatory Visit | Attending: Radiation Oncology | Admitting: Radiation Oncology

## 2022-11-23 DIAGNOSIS — Z510 Encounter for antineoplastic radiation therapy: Secondary | ICD-10-CM | POA: Diagnosis not present

## 2022-11-23 LAB — RAD ONC ARIA SESSION SUMMARY
Course Elapsed Days: 0
Plan Fractions Treated to Date: 1
Plan Prescribed Dose Per Fraction: 2.66 Gy
Plan Total Fractions Prescribed: 16
Plan Total Prescribed Dose: 42.56 Gy
Reference Point Dosage Given to Date: 2.66 Gy
Reference Point Session Dosage Given: 2.66 Gy
Session Number: 1

## 2022-11-24 ENCOUNTER — Other Ambulatory Visit: Payer: Self-pay

## 2022-11-24 ENCOUNTER — Inpatient Hospital Stay: Payer: Medicare HMO | Attending: Hematology and Oncology

## 2022-11-24 ENCOUNTER — Ambulatory Visit
Admission: RE | Admit: 2022-11-24 | Discharge: 2022-11-24 | Disposition: A | Discharge status: Home / Self Care | Payer: Medicare HMO | Source: Ambulatory Visit | Attending: Radiation Oncology | Admitting: Radiation Oncology

## 2022-11-24 DIAGNOSIS — Z510 Encounter for antineoplastic radiation therapy: Secondary | ICD-10-CM | POA: Diagnosis not present

## 2022-11-24 LAB — RAD ONC ARIA SESSION SUMMARY
Course Elapsed Days: 1
Plan Fractions Treated to Date: 2
Plan Prescribed Dose Per Fraction: 2.66 Gy
Plan Total Fractions Prescribed: 16
Plan Total Prescribed Dose: 42.56 Gy
Reference Point Dosage Given to Date: 5.32 Gy
Reference Point Session Dosage Given: 2.66 Gy
Session Number: 2

## 2022-11-25 ENCOUNTER — Ambulatory Visit: Payer: Medicare HMO

## 2022-11-25 ENCOUNTER — Inpatient Hospital Stay: Payer: Medicare HMO

## 2022-11-28 ENCOUNTER — Other Ambulatory Visit: Payer: Self-pay

## 2022-11-28 ENCOUNTER — Ambulatory Visit
Admission: RE | Admit: 2022-11-28 | Discharge: 2022-11-28 | Disposition: A | Discharge status: Home / Self Care | Payer: Medicare HMO | Source: Ambulatory Visit | Attending: Radiation Oncology | Admitting: Radiation Oncology

## 2022-11-28 ENCOUNTER — Inpatient Hospital Stay: Payer: Medicare HMO

## 2022-11-28 DIAGNOSIS — D0512 Intraductal carcinoma in situ of left breast: Secondary | ICD-10-CM

## 2022-11-28 DIAGNOSIS — Z510 Encounter for antineoplastic radiation therapy: Secondary | ICD-10-CM | POA: Diagnosis not present

## 2022-11-28 LAB — RAD ONC ARIA SESSION SUMMARY
Course Elapsed Days: 5
Plan Fractions Treated to Date: 3
Plan Prescribed Dose Per Fraction: 2.66 Gy
Plan Total Fractions Prescribed: 16
Plan Total Prescribed Dose: 42.56 Gy
Reference Point Dosage Given to Date: 7.98 Gy
Reference Point Session Dosage Given: 2.66 Gy
Session Number: 3

## 2022-11-28 MED ORDER — RADIAPLEXRX EX GEL: Freq: Once | CUTANEOUS | Status: AC

## 2022-11-28 MED ORDER — ALRA NON-METALLIC DEODORANT (RAD-ONC)
1.0000 | Freq: Once | TOPICAL | Status: AC
  Administered 2022-11-28: 1 via TOPICAL

## 2022-11-29 ENCOUNTER — Other Ambulatory Visit: Payer: Self-pay

## 2022-11-29 ENCOUNTER — Ambulatory Visit: Payer: Medicare HMO

## 2022-11-29 ENCOUNTER — Inpatient Hospital Stay: Payer: Medicare HMO

## 2022-11-29 ENCOUNTER — Ambulatory Visit
Admission: RE | Admit: 2022-11-29 | Discharge: 2022-11-29 | Disposition: A | Discharge status: Home / Self Care | Payer: Medicare HMO | Source: Ambulatory Visit | Attending: Radiation Oncology | Admitting: Radiation Oncology

## 2022-11-29 DIAGNOSIS — Z510 Encounter for antineoplastic radiation therapy: Secondary | ICD-10-CM | POA: Diagnosis not present

## 2022-11-29 LAB — RAD ONC ARIA SESSION SUMMARY
Course Elapsed Days: 6
Plan Fractions Treated to Date: 4
Plan Prescribed Dose Per Fraction: 2.66 Gy
Plan Total Fractions Prescribed: 16
Plan Total Prescribed Dose: 42.56 Gy
Reference Point Dosage Given to Date: 10.64 Gy
Reference Point Session Dosage Given: 2.66 Gy
Session Number: 4

## 2022-11-30 ENCOUNTER — Other Ambulatory Visit: Payer: Self-pay

## 2022-11-30 ENCOUNTER — Inpatient Hospital Stay: Payer: Medicare HMO

## 2022-11-30 ENCOUNTER — Ambulatory Visit
Admission: RE | Admit: 2022-11-30 | Discharge: 2022-11-30 | Disposition: A | Discharge status: Home / Self Care | Payer: Medicare HMO | Source: Ambulatory Visit | Attending: Radiation Oncology | Admitting: Radiation Oncology

## 2022-11-30 DIAGNOSIS — Z510 Encounter for antineoplastic radiation therapy: Secondary | ICD-10-CM | POA: Diagnosis not present

## 2022-11-30 LAB — RAD ONC ARIA SESSION SUMMARY
Course Elapsed Days: 7
Plan Fractions Treated to Date: 5
Plan Prescribed Dose Per Fraction: 2.66 Gy
Plan Total Fractions Prescribed: 16
Plan Total Prescribed Dose: 42.56 Gy
Reference Point Dosage Given to Date: 13.3 Gy
Reference Point Session Dosage Given: 2.66 Gy
Session Number: 5

## 2022-12-01 ENCOUNTER — Other Ambulatory Visit: Payer: Self-pay

## 2022-12-01 ENCOUNTER — Inpatient Hospital Stay: Payer: Medicare HMO

## 2022-12-01 ENCOUNTER — Ambulatory Visit
Admission: RE | Admit: 2022-12-01 | Discharge: 2022-12-01 | Disposition: A | Discharge status: Home / Self Care | Payer: Medicare HMO | Source: Ambulatory Visit | Attending: Radiation Oncology | Admitting: Radiation Oncology

## 2022-12-01 DIAGNOSIS — Z510 Encounter for antineoplastic radiation therapy: Secondary | ICD-10-CM | POA: Diagnosis not present

## 2022-12-01 LAB — RAD ONC ARIA SESSION SUMMARY
Course Elapsed Days: 8
Plan Fractions Treated to Date: 6
Plan Prescribed Dose Per Fraction: 2.66 Gy
Plan Total Fractions Prescribed: 16
Plan Total Prescribed Dose: 42.56 Gy
Reference Point Dosage Given to Date: 15.96 Gy
Reference Point Session Dosage Given: 2.66 Gy
Session Number: 6

## 2022-12-02 ENCOUNTER — Ambulatory Visit: Admission: RE | Admit: 2022-12-02 | Payer: Medicare HMO | Source: Ambulatory Visit

## 2022-12-02 ENCOUNTER — Other Ambulatory Visit: Payer: Self-pay

## 2022-12-02 ENCOUNTER — Ambulatory Visit
Admission: RE | Admit: 2022-12-02 | Discharge: 2022-12-02 | Disposition: A | Discharge status: Home / Self Care | Payer: Medicare HMO | Source: Ambulatory Visit | Attending: Radiation Oncology | Admitting: Radiation Oncology

## 2022-12-02 ENCOUNTER — Inpatient Hospital Stay: Payer: Medicare HMO

## 2022-12-02 DIAGNOSIS — Z510 Encounter for antineoplastic radiation therapy: Secondary | ICD-10-CM | POA: Diagnosis not present

## 2022-12-02 LAB — RAD ONC ARIA SESSION SUMMARY
Course Elapsed Days: 9
Plan Fractions Treated to Date: 7
Plan Prescribed Dose Per Fraction: 2.66 Gy
Plan Total Fractions Prescribed: 16
Plan Total Prescribed Dose: 42.56 Gy
Reference Point Dosage Given to Date: 18.62 Gy
Reference Point Session Dosage Given: 2.66 Gy
Session Number: 7

## 2022-12-05 ENCOUNTER — Ambulatory Visit
Admission: RE | Admit: 2022-12-05 | Discharge: 2022-12-05 | Disposition: A | Discharge status: Home / Self Care | Payer: Medicare HMO | Source: Ambulatory Visit | Attending: Radiation Oncology | Admitting: Radiation Oncology

## 2022-12-05 ENCOUNTER — Other Ambulatory Visit: Payer: Self-pay

## 2022-12-05 ENCOUNTER — Inpatient Hospital Stay: Payer: Medicare HMO

## 2022-12-05 DIAGNOSIS — Z510 Encounter for antineoplastic radiation therapy: Secondary | ICD-10-CM | POA: Diagnosis not present

## 2022-12-05 LAB — RAD ONC ARIA SESSION SUMMARY
Course Elapsed Days: 12
Plan Fractions Treated to Date: 8
Plan Prescribed Dose Per Fraction: 2.66 Gy
Plan Total Fractions Prescribed: 16
Plan Total Prescribed Dose: 42.56 Gy
Reference Point Dosage Given to Date: 21.28 Gy
Reference Point Session Dosage Given: 2.66 Gy
Session Number: 8

## 2022-12-06 ENCOUNTER — Inpatient Hospital Stay: Payer: Medicare HMO

## 2022-12-06 ENCOUNTER — Telehealth: Payer: Self-pay | Admitting: Radiation Oncology

## 2022-12-06 ENCOUNTER — Other Ambulatory Visit: Payer: Self-pay

## 2022-12-06 ENCOUNTER — Ambulatory Visit
Admission: RE | Admit: 2022-12-06 | Discharge: 2022-12-06 | Disposition: A | Discharge status: Home / Self Care | Payer: Medicare HMO | Source: Ambulatory Visit | Attending: Radiation Oncology | Admitting: Radiation Oncology

## 2022-12-06 DIAGNOSIS — Z510 Encounter for antineoplastic radiation therapy: Secondary | ICD-10-CM | POA: Diagnosis not present

## 2022-12-06 LAB — RAD ONC ARIA SESSION SUMMARY
Course Elapsed Days: 13
Plan Fractions Treated to Date: 9
Plan Prescribed Dose Per Fraction: 2.66 Gy
Plan Total Fractions Prescribed: 16
Plan Total Prescribed Dose: 42.56 Gy
Reference Point Dosage Given to Date: 23.94 Gy
Reference Point Session Dosage Given: 2.66 Gy
Session Number: 9

## 2022-12-06 NOTE — Telephone Encounter (Signed)
7/30 @ 8:16 am Patient called to let someone know her ride never show up to bring her to her treatment appt for today at 8:30 am on L3 machine.  Called and spoke to [place] (Support RTT), so they are aware.

## 2022-12-07 ENCOUNTER — Other Ambulatory Visit: Payer: Self-pay

## 2022-12-07 ENCOUNTER — Ambulatory Visit
Admission: RE | Admit: 2022-12-07 | Discharge: 2022-12-07 | Disposition: A | Discharge status: Home / Self Care | Payer: Medicare HMO | Source: Ambulatory Visit | Attending: Radiation Oncology | Admitting: Radiation Oncology

## 2022-12-07 ENCOUNTER — Inpatient Hospital Stay: Payer: Medicare HMO

## 2022-12-07 DIAGNOSIS — Z510 Encounter for antineoplastic radiation therapy: Secondary | ICD-10-CM | POA: Diagnosis not present

## 2022-12-07 LAB — RAD ONC ARIA SESSION SUMMARY
Course Elapsed Days: 14
Plan Fractions Treated to Date: 10
Plan Prescribed Dose Per Fraction: 2.66 Gy
Plan Total Fractions Prescribed: 16
Plan Total Prescribed Dose: 42.56 Gy
Reference Point Dosage Given to Date: 26.6 Gy
Reference Point Session Dosage Given: 2.66 Gy
Session Number: 10

## 2022-12-08 ENCOUNTER — Ambulatory Visit
Admission: RE | Admit: 2022-12-08 | Discharge: 2022-12-08 | Disposition: A | Discharge status: Home / Self Care | Payer: Medicare HMO | Source: Ambulatory Visit | Attending: Radiation Oncology | Admitting: Radiation Oncology

## 2022-12-08 ENCOUNTER — Other Ambulatory Visit: Payer: Self-pay

## 2022-12-08 ENCOUNTER — Inpatient Hospital Stay: Payer: Medicare HMO

## 2022-12-08 DIAGNOSIS — Z79811 Long term (current) use of aromatase inhibitors: Secondary | ICD-10-CM | POA: Insufficient documentation

## 2022-12-08 DIAGNOSIS — D0512 Intraductal carcinoma in situ of left breast: Secondary | ICD-10-CM | POA: Insufficient documentation

## 2022-12-08 DIAGNOSIS — Z510 Encounter for antineoplastic radiation therapy: Secondary | ICD-10-CM | POA: Insufficient documentation

## 2022-12-08 DIAGNOSIS — Z923 Personal history of irradiation: Secondary | ICD-10-CM | POA: Insufficient documentation

## 2022-12-08 LAB — RAD ONC ARIA SESSION SUMMARY
Course Elapsed Days: 15
Plan Fractions Treated to Date: 11
Plan Prescribed Dose Per Fraction: 2.66 Gy
Plan Total Fractions Prescribed: 16
Plan Total Prescribed Dose: 42.56 Gy
Reference Point Dosage Given to Date: 29.26 Gy
Reference Point Session Dosage Given: 2.66 Gy
Session Number: 11

## 2022-12-09 ENCOUNTER — Ambulatory Visit: Admission: RE | Admit: 2022-12-09 | Payer: Medicare HMO | Source: Ambulatory Visit

## 2022-12-09 ENCOUNTER — Other Ambulatory Visit: Payer: Self-pay

## 2022-12-09 ENCOUNTER — Inpatient Hospital Stay: Payer: Medicare HMO

## 2022-12-09 DIAGNOSIS — Z510 Encounter for antineoplastic radiation therapy: Secondary | ICD-10-CM | POA: Diagnosis not present

## 2022-12-09 LAB — RAD ONC ARIA SESSION SUMMARY
Course Elapsed Days: 16
Plan Fractions Treated to Date: 12
Plan Prescribed Dose Per Fraction: 2.66 Gy
Plan Total Fractions Prescribed: 16
Plan Total Prescribed Dose: 42.56 Gy
Reference Point Dosage Given to Date: 31.92 Gy
Reference Point Session Dosage Given: 2.66 Gy
Session Number: 12

## 2022-12-12 ENCOUNTER — Ambulatory Visit: Admission: RE | Admit: 2022-12-12 | Payer: Medicare HMO | Source: Ambulatory Visit

## 2022-12-12 ENCOUNTER — Other Ambulatory Visit: Payer: Self-pay

## 2022-12-12 DIAGNOSIS — Z510 Encounter for antineoplastic radiation therapy: Secondary | ICD-10-CM | POA: Diagnosis not present

## 2022-12-12 LAB — RAD ONC ARIA SESSION SUMMARY
Course Elapsed Days: 19
Plan Fractions Treated to Date: 13
Plan Prescribed Dose Per Fraction: 2.66 Gy
Plan Total Fractions Prescribed: 16
Plan Total Prescribed Dose: 42.56 Gy
Reference Point Dosage Given to Date: 34.58 Gy
Reference Point Session Dosage Given: 2.66 Gy
Session Number: 13

## 2022-12-13 ENCOUNTER — Inpatient Hospital Stay: Payer: Medicare HMO

## 2022-12-13 ENCOUNTER — Other Ambulatory Visit: Payer: Self-pay

## 2022-12-13 ENCOUNTER — Inpatient Hospital Stay: Payer: Medicare HMO | Admitting: Genetic Counselor

## 2022-12-13 ENCOUNTER — Encounter: Payer: Self-pay | Admitting: Genetic Counselor

## 2022-12-13 ENCOUNTER — Other Ambulatory Visit: Payer: Self-pay | Admitting: Genetic Counselor

## 2022-12-13 ENCOUNTER — Ambulatory Visit: Admission: RE | Admit: 2022-12-13 | Payer: Medicare HMO | Source: Ambulatory Visit

## 2022-12-13 DIAGNOSIS — D0512 Intraductal carcinoma in situ of left breast: Secondary | ICD-10-CM | POA: Diagnosis not present

## 2022-12-13 DIAGNOSIS — Z510 Encounter for antineoplastic radiation therapy: Secondary | ICD-10-CM | POA: Diagnosis not present

## 2022-12-13 DIAGNOSIS — Z1379 Encounter for other screening for genetic and chromosomal anomalies: Secondary | ICD-10-CM

## 2022-12-13 DIAGNOSIS — Z803 Family history of malignant neoplasm of breast: Secondary | ICD-10-CM | POA: Diagnosis not present

## 2022-12-13 LAB — RAD ONC ARIA SESSION SUMMARY
Course Elapsed Days: 20
Plan Fractions Treated to Date: 14
Plan Prescribed Dose Per Fraction: 2.66 Gy
Plan Total Fractions Prescribed: 16
Plan Total Prescribed Dose: 42.56 Gy
Reference Point Dosage Given to Date: 37.24 Gy
Reference Point Session Dosage Given: 2.66 Gy
Session Number: 14

## 2022-12-13 LAB — GENETIC SCREENING ORDER

## 2022-12-13 NOTE — Progress Notes (Unsigned)
REFERRING PROVIDER: Serena Croissant, MD  PRIMARY PROVIDER:  Gwenyth Bender, MD  PRIMARY REASON FOR VISIT:  Encounter Diagnoses  Name Primary?   Ductal carcinoma in situ (DCIS) of left breast Yes   FH: breast cancer in first degree relative when <67 years old    HISTORY OF PRESENT ILLNESS:   Amy Vincent, a 67 y.o. female, was seen for a [place] cancer genetics consultation at the request of Dr. Pamelia Hoit due to a personal and family history of cancer.  Amy Vincent presents to clinic today to discuss the possibility of a hereditary predisposition to cancer, to discuss genetic testing, and to further clarify her future cancer risks, as well as potential cancer risks for family members.   In May 2024, at the age of 60, Amy Vincent was diagnosed with ductal carcinoma in situ of the left breast (ER/PR positive). The treatment plan included a lumpectomy and radiation.  CANCER HISTORY:  Oncology History  Ductal carcinoma in situ (DCIS) of left breast  09/27/2022 Initial Diagnosis   Screening mammogram detected left breast asymmetry, no ultrasound correlate, stereotactic biopsy: Low-grade DCIS ER 100%, PR 95%   10/13/2022 Surgery   Left lumpectomy: DCIS solid type intermediate grade without necrosis, 1 cm, margins negative, ER 100%, PR 95%     RISK FACTORS:  Menarche was at age 77.  First live birth at age n/a.  OCP use for approximately 2-3 years.  Ovaries intact: no.  Uterus intact: no.  Menopausal status: postmenopausal.  HRT use: 0 years. Colonoscopy: yes; normal. Mammogram within the last year: yes.  Past Medical History:  Diagnosis Date   Breast cancer (HCC) 09/22/2022   High cholesterol    Hx of adenomatous polyp of rectum 07/07/2016   Hypertension     Past Surgical History:  Procedure Laterality Date   ABDOMINAL HYSTERECTOMY  1999   BREAST BIOPSY Left 09/22/2022   MM LT BREAST BX W LOC DEV 1ST LESION IMAGE BX SPEC STEREO GUIDE 09/22/2022 [facility]   BREAST BIOPSY   10/12/2022   MM LT RADIOACTIVE SEED LOC MAMMO GUIDE 10/12/2022 [facility]   BREAST LUMPECTOMY WITH RADIOACTIVE SEED LOCALIZATION Left 10/13/2022   Procedure: LEFT BREAST LUMPECTOMY WITH RADIOACTIVE SEED LOCALIZATION;  Surgeon: Manus Rudd, MD;  Location: [facility];  Service: General;  Laterality: Left;   COLONOSCOPY  2006   Dr. Mann/ Normal Colonoscopy   INCISION AND DRAINAGE     I & D of an abscess on back   left arm surgery  2004   orthopedic surgery left arm due to fracture as a child that healed improperly    Social History   Socioeconomic History   Marital status: Significant Other    Spouse name: Not on file   Number of children: 0   Years of education: Not on file   Highest education level: Not on file  Occupational History   Not on file  Tobacco Use   Smoking status: Former    Current packs/day: 0.00    Average packs/day: 1 pack/day for 15.0 years (15.0 ttl pk-yrs)    Types: Cigarettes    Start date: 05/10/1979    Quit date: 05/09/1994    Years since quitting: 28.6   Smokeless tobacco: Never  Vaping Use   Vaping status: Never Used  Substance and Sexual Activity   Alcohol use: Yes    Alcohol/week: 5.0 standard drinks of alcohol    Types: 2 Shots of liquor, 3 Standard drinks or equivalent per  week    Comment: 2 a week   Drug use: No   Sexual activity: Not on file  Other Topics Concern   Not on file  Social History Narrative   No children   On disability   Originally from [place]   Single, sister and brother liv   Lives with girlfriend    Enjoys gardening, antique "flips"   Social Determinants of Health   Financial Resource Strain: Not on file  Food Insecurity: No Food Insecurity (10/07/2022)   Hunger Vital Sign    Worried About Running Out of Food in the Last Year: Never true    Ran Out of Food in the Last Year: Never true  Transportation Needs: No Transportation Needs (10/07/2022)   PRAPARE - Administrator, Civil Service  (Medical): No    Lack of Transportation (Non-Medical): No  Physical Activity: Not on file  Stress: Not on file  Social Connections: Not on file     FAMILY HISTORY:  We obtained a detailed, 4-generation family history.  Significant diagnoses are listed below: Family History  Problem Relation Age of Onset   Breast cancer Mother 82   Diabetes Father    Breast cancer Maternal Aunt 67 - 59   Breast cancer Maternal Aunt    Breast cancer Maternal Aunt    Breast cancer Maternal Aunt    Breast cancer Maternal Aunt        dx. <50   Stomach cancer Maternal Aunt    Hypertension Paternal Uncle    Prostate cancer Paternal Uncle 56   Breast cancer Cousin 50       maternal first cousin once removed with triple negative breast cancer   Colon cancer Cousin        paternal first cousin   Colon polyps Neg Hx        Amy Vincent's mother was diagnosed with breast cancer at age 67, she died at age 51. Her mother's identical twin sister was diagnosed with breast cancer in her 7s, she died at age 81. This aunt's granddaughter (Amy Vincent first cousin once removed) was diagnosed with triple negative breast cancer in her 30s. Amy Vincent has 5 additional maternal aunts who were all diagnosed with breast cancer at unknown ages, all are deceased. Her paternal uncle was diagnosed with prostate cancer at age 15 and this uncle's daughter (Amy Vincent first cousin) was diagnosed with colon cancer. Amy Vincent reports her sister had negative genetic testing and there is no reported Ashkenazi Jewish ancestry.   GENETIC COUNSELING ASSESSMENT: Amy Vincent is a 67 y.o. female with a personal and family history of cancer which is somewhat suggestive of a hereditary predisposition to cancer. We, therefore, discussed and recommended the following at today's visit.   DISCUSSION: We discussed that 5 - 10% of cancer is hereditary, with most cases of breast cancer associated with BRCA1/2.  There are other genes that can be  associated with hereditary breast cancer syndromes.  We discussed that testing is beneficial for several reasons including knowing how to follow individuals after completing their treatment, identifying whether potential treatment options would be beneficial, and understanding if other family members could be at risk for cancer and allowing them to undergo genetic testing.   We reviewed the characteristics, features and inheritance patterns of hereditary cancer syndromes. We also discussed genetic testing, including the appropriate family members to test, the process of testing, insurance coverage and turn-around-time for results. We discussed the implications of a negative,  positive, carrier and/or variant of uncertain significant result. We recommended Amy Vincent pursue genetic testing for a panel that includes genes associated with breast, colon, and prostate cancer.   Amy Vincent  was offered a common hereditary cancer panel (48 genes) and an expanded pan-cancer panel (70 genes). Amy Vincent was informed of the benefits and limitations of each panel, including that expanded pan-cancer panels contain genes that do not have clear management guidelines at this point in time.  We also discussed that as the number of genes included on a panel increases, the chances of variants of uncertain significance increases. After considering the benefits and limitations of each gene panel, Amy Vincent elected to have Amy Vincent.  The Multi-Cancer + RNA Panel offered by [facility] includes sequencing and/or deletion/duplication analysis of the following 70 genes:  AIP*, ALK, APC*, ATM*, AXIN2*, BAP1*, BARD1*, BLM*, BMPR1A*, BRCA1*, BRCA2*, BRIP1*, CDC73*, CDH1*, CDK4, CDKN1B*, CDKN2A, CHEK2*, CTNNA1*, DICER1*, EPCAM (del/dup only), EGFR, FH*, FLCN*, GREM1 (promoter dup only), HOXB13, KIT, LZTR1, MAX*, MBD4, MEN1*, MET, MITF, MLH1*, MSH2*, MSH3*, MSH6*, MUTYH*, NF1*, NF2*, NTHL1*, PALB2*, PDGFRA, PMS2*, POLD1*,  POLE*, POT1*, PRKAR1A*, PTCH1*, PTEN*, RAD51C*, RAD51D*, RB1*, RET, SDHA* (sequencing only), SDHAF2*, SDHB*, SDHC*, SDHD*, SMAD4*, SMARCA4*, SMARCB1*, SMARCE1*, STK11*, SUFU*, TMEM127*, TP53*, TSC1*, TSC2*, VHL*. RNA analysis is performed for * genes.  Based on Ms. Suchocki's personal and family history of cancer, she meets medical criteria for genetic testing. Despite that she meets criteria, she may still have an out of pocket cost. We discussed that if her out of pocket cost for testing is over $100, the laboratory will call and confirm whether she wants to proceed with testing.  If the out of pocket cost of testing is less than $100 she will be billed by the genetic testing laboratory.   PLAN: After considering the risks, benefits, and limitations, Amy Vincent provided informed consent to pursue genetic testing and the blood sample was sent to [facility] for analysis of the Multi-Cancer Panel. Results should be available within approximately 2-3 weeks' time, at which point they will be disclosed by telephone to Amy Vincent, as will any additional recommendations warranted by these results. Amy Vincent will receive a summary of her genetic counseling visit and a copy of her results once available. This information will also be available in Epic.   Amy Vincent questions were answered to her satisfaction today. Our contact information was provided should additional questions or concerns arise. Thank you for the referral and allowing [place] to share in the care of your patient.   [name], MS, [facility] Genetic Counselor [email or web address] (P) [phone]  The patient was seen for a total of 40 minutes in face-to-face genetic counseling. The patient was seen alone.  Drs. Pamelia Hoit and/or Mosetta Putt were available to discuss this case as needed.   _______________________________________________________________________ For Office Staff:  Number of people involved in session: 1 Was an Intern/  student involved with case: no

## 2022-12-14 ENCOUNTER — Encounter: Payer: Self-pay | Admitting: Genetic Counselor

## 2022-12-14 ENCOUNTER — Ambulatory Visit
Admission: RE | Admit: 2022-12-14 | Discharge: 2022-12-14 | Disposition: A | Discharge status: Home / Self Care | Payer: Medicare HMO | Source: Ambulatory Visit | Attending: Radiation Oncology | Admitting: Radiation Oncology

## 2022-12-14 ENCOUNTER — Other Ambulatory Visit: Payer: Self-pay

## 2022-12-14 ENCOUNTER — Inpatient Hospital Stay: Payer: Medicare HMO

## 2022-12-14 DIAGNOSIS — Z510 Encounter for antineoplastic radiation therapy: Secondary | ICD-10-CM | POA: Diagnosis not present

## 2022-12-14 LAB — RAD ONC ARIA SESSION SUMMARY
Course Elapsed Days: 21
Plan Fractions Treated to Date: 15
Plan Prescribed Dose Per Fraction: 2.66 Gy
Plan Total Fractions Prescribed: 16
Plan Total Prescribed Dose: 42.56 Gy
Reference Point Dosage Given to Date: 39.9 Gy
Reference Point Session Dosage Given: 2.66 Gy
Session Number: 15

## 2022-12-15 ENCOUNTER — Other Ambulatory Visit: Payer: Self-pay

## 2022-12-15 ENCOUNTER — Ambulatory Visit
Admission: RE | Admit: 2022-12-15 | Discharge: 2022-12-15 | Disposition: A | Discharge status: Home / Self Care | Payer: Medicare HMO | Source: Ambulatory Visit | Attending: Radiation Oncology | Admitting: Radiation Oncology

## 2022-12-15 ENCOUNTER — Inpatient Hospital Stay: Payer: Medicare HMO

## 2022-12-15 DIAGNOSIS — Z510 Encounter for antineoplastic radiation therapy: Secondary | ICD-10-CM | POA: Diagnosis not present

## 2022-12-15 LAB — RAD ONC ARIA SESSION SUMMARY
Course Elapsed Days: 22
Plan Fractions Treated to Date: 16
Plan Prescribed Dose Per Fraction: 2.66 Gy
Plan Total Fractions Prescribed: 16
Plan Total Prescribed Dose: 42.56 Gy
Reference Point Dosage Given to Date: 42.56 Gy
Reference Point Session Dosage Given: 2.66 Gy
Session Number: 16

## 2022-12-16 ENCOUNTER — Other Ambulatory Visit: Payer: Self-pay

## 2022-12-16 ENCOUNTER — Ambulatory Visit
Admission: RE | Admit: 2022-12-16 | Discharge: 2022-12-16 | Disposition: A | Discharge status: Home / Self Care | Payer: Medicare HMO | Source: Ambulatory Visit | Attending: Radiation Oncology | Admitting: Radiation Oncology

## 2022-12-16 DIAGNOSIS — Z510 Encounter for antineoplastic radiation therapy: Secondary | ICD-10-CM | POA: Diagnosis not present

## 2022-12-16 LAB — RAD ONC ARIA SESSION SUMMARY
Course Elapsed Days: 23
Plan Fractions Treated to Date: 1
Plan Prescribed Dose Per Fraction: 2 Gy
Plan Total Fractions Prescribed: 4
Plan Total Prescribed Dose: 8 Gy
Reference Point Dosage Given to Date: 2 Gy
Reference Point Session Dosage Given: 2 Gy
Session Number: 17

## 2022-12-19 ENCOUNTER — Ambulatory Visit
Admission: RE | Admit: 2022-12-19 | Discharge: 2022-12-19 | Disposition: A | Discharge status: Home / Self Care | Payer: Medicare HMO | Source: Ambulatory Visit | Attending: Radiation Oncology | Admitting: Radiation Oncology

## 2022-12-19 ENCOUNTER — Inpatient Hospital Stay: Payer: Medicare HMO

## 2022-12-19 ENCOUNTER — Other Ambulatory Visit: Payer: Self-pay

## 2022-12-19 DIAGNOSIS — Z510 Encounter for antineoplastic radiation therapy: Secondary | ICD-10-CM | POA: Diagnosis not present

## 2022-12-19 LAB — RAD ONC ARIA SESSION SUMMARY
Course Elapsed Days: 26
Plan Fractions Treated to Date: 2
Plan Prescribed Dose Per Fraction: 2 Gy
Plan Total Fractions Prescribed: 4
Plan Total Prescribed Dose: 8 Gy
Reference Point Dosage Given to Date: 4 Gy
Reference Point Session Dosage Given: 2 Gy
Session Number: 18

## 2022-12-20 ENCOUNTER — Ambulatory Visit: Payer: Medicare HMO

## 2022-12-20 ENCOUNTER — Other Ambulatory Visit: Payer: Self-pay

## 2022-12-20 ENCOUNTER — Inpatient Hospital Stay: Payer: Medicare HMO

## 2022-12-20 ENCOUNTER — Ambulatory Visit
Admission: RE | Admit: 2022-12-20 | Discharge: 2022-12-20 | Disposition: A | Discharge status: Home / Self Care | Payer: Medicare HMO | Source: Ambulatory Visit | Attending: Radiation Oncology | Admitting: Radiation Oncology

## 2022-12-20 ENCOUNTER — Inpatient Hospital Stay (HOSPITAL_BASED_OUTPATIENT_CLINIC_OR_DEPARTMENT_OTHER): Payer: Medicare HMO | Admitting: Hematology and Oncology

## 2022-12-20 VITALS — BP 141/76 | HR 62 | Temp 97.9°F | Resp 17 | Wt 193.2 lb

## 2022-12-20 DIAGNOSIS — D0512 Intraductal carcinoma in situ of left breast: Secondary | ICD-10-CM

## 2022-12-20 DIAGNOSIS — Z510 Encounter for antineoplastic radiation therapy: Secondary | ICD-10-CM | POA: Diagnosis not present

## 2022-12-20 LAB — RAD ONC ARIA SESSION SUMMARY
Course Elapsed Days: 27
Plan Fractions Treated to Date: 3
Plan Prescribed Dose Per Fraction: 2 Gy
Plan Total Fractions Prescribed: 4
Plan Total Prescribed Dose: 8 Gy
Reference Point Dosage Given to Date: 6 Gy
Reference Point Session Dosage Given: 2 Gy
Session Number: 19

## 2022-12-20 MED ORDER — TAMOXIFEN CITRATE 10 MG PO TABS: 10.0000 mg | ORAL_TABLET | Freq: Every day | ORAL | 3 refills | Status: DC

## 2022-12-20 NOTE — Progress Notes (Signed)
Patient Care Team: Gwenyth Bender, MD as PCP - General (Internal Medicine) Mirna Mires, MD as Consulting Physician (Family Medicine) Manus Rudd, MD as Consulting Physician (General Surgery) Serena Croissant, MD as Consulting Physician (Hematology and Oncology) Dorothy Puffer, MD as Consulting Physician (Radiation Oncology)  DIAGNOSIS:  Encounter Diagnosis  Name Primary?   Ductal carcinoma in situ (DCIS) of left breast Yes    SUMMARY OF ONCOLOGIC HISTORY: Oncology History  Ductal carcinoma in situ (DCIS) of left breast  09/27/2022 Initial Diagnosis   Screening mammogram detected left breast asymmetry, no ultrasound correlate, stereotactic biopsy: Low-grade DCIS ER 100%, PR 95%   10/13/2022 Surgery   Left lumpectomy: DCIS solid type intermediate grade without necrosis, 1 cm, margins negative, ER 100%, PR 95%   11/24/2022 - 12/21/2022 Radiation Therapy   Adjuvant radiation   01/08/2023 -  Anti-estrogen oral therapy   Anastrozole 1 mg daily     CHIEF COMPLIANT: Follow-up after radiation  INTERVAL HISTORY: Amy Vincent is a 67 y.o. female is here because of recent diagnosis of left breast DCIS. Follow-up to start antiestrogen therapy. Tolerating radiation extremely well. Patient does have hot flashes. Denies any other symptoms.  She does have mild radiation dermatitis especially under the breast.   ALLERGIES:  has No Known Allergies.  MEDICATIONS:  Current Outpatient Medications  Medication Sig Dispense Refill   amLODipine (NORVASC) 10 MG tablet 10 mg.     atenolol (TENORMIN) 50 MG tablet Take 50 mg by mouth daily.     atorvastatin (LIPITOR) 20 MG tablet Take 20 mg by mouth daily.     losartan-hydrochlorothiazide (HYZAAR) 100-25 MG tablet Take 1 tablet by mouth daily.     meloxicam (MOBIC) 7.5 MG tablet Take by mouth.     [START ON 01/08/2023] tamoxifen (NOLVADEX) 10 MG tablet Take 1 tablet (10 mg total) by mouth daily. 90 tablet 3   Current Facility-Administered Medications   Medication Dose Route Frequency Provider Last Rate Last Admin   0.9 %  sodium chloride infusion  500 mL Intravenous Continuous Iva Boop, MD        PHYSICAL EXAMINATION: ECOG PERFORMANCE STATUS: 1 - Symptomatic but completely ambulatory  Vitals:   12/20/22 0850  BP: (!) 141/76  Pulse: 62  Resp: 17  Temp: 97.9 F (36.6 C)   Filed Weights   12/20/22 0850  Weight: 193 lb 3.2 oz (87.6 kg)      LABORATORY DATA:  I have reviewed the data as listed    Latest Ref Rng & Units 02/24/2018    6:35 AM 03/01/2016   10:15 AM 11/23/2015    4:43 PM  CMP  Glucose 70 - 99 mg/dL 91  99  81   BUN 8 - 23 mg/dL 15  9  24    Creatinine 0.44 - 1.00 mg/dL 1.61  0.96  0.45   Sodium 135 - 145 mmol/L 140  140  142   Potassium 3.5 - 5.1 mmol/L 3.8  4.1  4.3   Chloride 98 - 111 mmol/L 106  109  108   CO2 22 - 32 mmol/L 26  25  28    Calcium 8.9 - 10.3 mg/dL 9.5  9.4  9.4   Total Protein 6.5 - 8.1 g/dL  6.7    Total Bilirubin 0.3 - 1.2 mg/dL  0.9    Alkaline Phos 38 - 126 U/L  106    AST 15 - 41 U/L  16    ALT 14 - 54 U/L  19      Lab Results  Component Value Date   WBC 7.5 02/24/2018   HGB 15.3 (H) 02/24/2018   HCT 46.5 (H) 02/24/2018   MCV 89.4 02/24/2018   PLT 257 02/24/2018   NEUTROABS 3.5 02/24/2018    ASSESSMENT & PLAN:  Ductal carcinoma in situ (DCIS) of left breast 09/27/2022: Screening mammogram detected left breast asymmetry, biopsy revealed low-grade DCIS that was ER/PR positive 10/13/2022: Left lumpectomy: DCIS solid type intermediate grade without necrosis, 1 cm, margins negative, ER 100%, PR 95% 11/24/2022-12/21/2022: Adjuvant radiation  Tamoxifen counseling: We discussed the risks and benefits of tamoxifen. These include but not limited to insomnia, hot flashes, mood changes, vaginal dryness, and weight gain. Although rare, serious side effects including endometrial cancer, risk of blood clots were also discussed. We strongly believe that the benefits far outweigh the  risks. Patient understands these risks and consented to starting treatment. Planned treatment duration is 5 years.  Tamoxifen to start 01/08/2023 Return to clinic in 3 months for survivorship care plan visit  No orders of the defined types were placed in this encounter.  The patient has a good understanding of the overall plan. she agrees with it. she will call with any problems that may develop before the next visit here. Total time spent: 30 mins including face to face time and time spent for planning, charting and co-ordination of care   Tamsen Meek, MD 12/20/22    I Janan Ridge am acting as a Neurosurgeon for [employer]  I have reviewed the above documentation for accuracy and completeness, and I agree with the above.

## 2022-12-20 NOTE — Assessment & Plan Note (Signed)
09/27/2022: Screening mammogram detected left breast asymmetry, biopsy revealed low-grade DCIS that was ER/PR positive 10/13/2022: Left lumpectomy: DCIS solid type intermediate grade without necrosis, 1 cm, margins negative, ER 100%, PR 95% 11/24/2022-12/21/2022: Adjuvant radiation  Tamoxifen counseling: We discussed the risks and benefits of tamoxifen. These include but not limited to insomnia, hot flashes, mood changes, vaginal dryness, and weight gain. Although rare, serious side effects including endometrial cancer, risk of blood clots were also discussed. We strongly believe that the benefits far outweigh the risks. Patient understands these risks and consented to starting treatment. Planned treatment duration is 5 years.  Return to clinic in 3 months for survivorship care plan visit

## 2022-12-21 ENCOUNTER — Ambulatory Visit
Admission: RE | Admit: 2022-12-21 | Discharge: 2022-12-21 | Disposition: A | Discharge status: Home / Self Care | Payer: Medicare HMO | Source: Ambulatory Visit | Attending: Radiation Oncology | Admitting: Radiation Oncology

## 2022-12-21 ENCOUNTER — Other Ambulatory Visit: Payer: Self-pay

## 2022-12-21 ENCOUNTER — Inpatient Hospital Stay: Payer: Medicare HMO

## 2022-12-21 ENCOUNTER — Telehealth: Payer: Self-pay | Admitting: Genetic Counselor

## 2022-12-21 ENCOUNTER — Encounter: Payer: Self-pay | Admitting: Genetic Counselor

## 2022-12-21 DIAGNOSIS — Z510 Encounter for antineoplastic radiation therapy: Secondary | ICD-10-CM | POA: Diagnosis not present

## 2022-12-21 DIAGNOSIS — Z1379 Encounter for other screening for genetic and chromosomal anomalies: Secondary | ICD-10-CM | POA: Insufficient documentation

## 2022-12-21 LAB — RAD ONC ARIA SESSION SUMMARY
Course Elapsed Days: 28
Plan Fractions Treated to Date: 4
Plan Prescribed Dose Per Fraction: 2 Gy
Plan Total Fractions Prescribed: 4
Plan Total Prescribed Dose: 8 Gy
Reference Point Dosage Given to Date: 8 Gy
Reference Point Session Dosage Given: 2 Gy
Session Number: 20

## 2022-12-21 NOTE — Telephone Encounter (Addendum)
I contacted [name] to discuss her genetic testing results. No pathogenic variants were identified in the 70 genes analyzed. Of note, a variant of uncertain significance was identified in the [facility] gene. Detailed clinic note to follow.  The test report has been scanned into EPIC and is located under the Molecular Pathology section of the Results Review tab.  A portion of the result report is included below for reference.   [name], MS, [facility] Genetic Counselor [name] (P) [phone]

## 2022-12-24 NOTE — Radiation Completion Notes (Signed)
  Radiation Oncology         ([phone]) [phone] ________________________________  Name: Amy Vincent MRN: [id number]  Date of Service: 12/21/2022  DOB: Aug 17, 1955  End of Treatment Note    Diagnosis:  Intermediate Grade, ER/PR positive DCIS of the left breast.   Intent: Curative     ==========DELIVERED PLANS==========  First Treatment Date: 2022-11-23 - Last Treatment Date: 2022-12-21   Plan Name: Breast_L_BH Site: Breast, Left Technique: 3D Mode: Photon Dose Per Fraction: 2.66 Gy Prescribed Dose (Delivered / Prescribed): 42.56 Gy / 42.56 Gy Prescribed Fxs (Delivered / Prescribed): 16 / 16   Plan Name: Brst_L_Bst_BH Site: Breast, Left Technique: 3D Mode: Photon Dose Per Fraction: 2 Gy Prescribed Dose (Delivered / Prescribed): 8 Gy / 8 Gy Prescribed Fxs (Delivered / Prescribed): 4 / 4     ==========ON TREATMENT VISIT DATES========== 2022-11-28, 2022-12-02, 2022-12-09, 2022-12-16     See weekly On Treatment Notes in Epic for details. The patient tolerated radiation. She developed fatigue and anticipated skin changes in the treatment field.   The patient will receive a call in about one month from the radiation oncology department. She will continue follow up with Dr. Pamelia Hoit as well.      Osker Mason, PAC

## 2023-01-02 ENCOUNTER — Encounter: Payer: Self-pay | Admitting: Hematology and Oncology

## 2023-01-05 ENCOUNTER — Encounter: Payer: Self-pay | Admitting: Genetic Counselor

## 2023-01-18 ENCOUNTER — Ambulatory Visit: Payer: Self-pay | Admitting: Genetic Counselor

## 2023-01-18 ENCOUNTER — Encounter: Payer: Self-pay | Admitting: Genetic Counselor

## 2023-01-18 DIAGNOSIS — Z1379 Encounter for other screening for genetic and chromosomal anomalies: Secondary | ICD-10-CM

## 2023-01-18 NOTE — Progress Notes (Signed)
HPI:   Amy Vincent was previously seen in the [facility] due to a personal and family history of cancer and concerns regarding a hereditary predisposition to cancer. Please refer to our prior cancer genetics clinic note for more information regarding our discussion, assessment and recommendations, at the time. Amy Vincent recent genetic test results were disclosed to her, as were recommendations warranted by these results. These results and recommendations are discussed in more detail below.  CANCER HISTORY:  Oncology History  Ductal carcinoma in situ (DCIS) of left breast  09/27/2022 Initial Diagnosis   Screening mammogram detected left breast asymmetry, no ultrasound correlate, stereotactic biopsy: Low-grade DCIS ER 100%, PR 95%   10/13/2022 Surgery   Left lumpectomy: DCIS solid type intermediate grade without necrosis, 1 cm, margins negative, ER 100%, PR 95%   11/24/2022 - 12/21/2022 Radiation Therapy   Adjuvant radiation   01/08/2023 -  Anti-estrogen oral therapy   Anastrozole 1 mg daily    Genetic Testing   [facility] Multi-Cancer Panel+RNA was Negative. 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 Report date is 12/19/2022.   The Multi-Cancer + RNA Panel offered by [facility] includes sequencing and/or deletion/duplication analysis of the following 70 genes:  AIP*, ALK, APC*, ATM*, AXIN2*, BAP1*, BARD1*, BLM*, BMPR1A*, BRCA1*, BRCA2*, BRIP1*, CDC73*, CDH1*, CDK4, CDKN1B*, CDKN2A, CHEK2*, CTNNA1*, DICER1*, EPCAM (del/dup only), EGFR, FH*, FLCN*, GREM1 (promoter dup only), HOXB13, KIT, LZTR1, MAX*, MBD4, MEN1*, MET, MITF, MLH1*, MSH2*, MSH3*, MSH6*, MUTYH*, NF1*, NF2*, NTHL1*, PALB2*, PDGFRA, PMS2*, POLD1*, POLE*, POT1*, PRKAR1A*, PTCH1*, PTEN*, RAD51C*, RAD51D*, RB1*, RET, SDHA* (sequencing only), SDHAF2*, SDHB*, SDHC*, SDHD*, SMAD4*, SMARCA4*, SMARCB1*, SMARCE1*, STK11*, SUFU*, TMEM127*, TP53*, TSC1*, TSC2*, VHL*. RNA analysis is  performed for * genes.     FAMILY HISTORY:  We obtained a detailed, 4-generation family history.  Significant diagnoses are listed below:      Family History  Problem Relation Age of Onset   Breast cancer Mother 50   Diabetes Father     Breast cancer Maternal Aunt 34 - 59   Breast cancer Maternal Aunt     Breast cancer Maternal Aunt     Breast cancer Maternal Aunt     Breast cancer Maternal Aunt          dx. <50   Stomach cancer Maternal Aunt     Hypertension Paternal Uncle     Prostate cancer Paternal Uncle 19   Breast cancer Cousin 33        maternal first cousin once removed with triple negative breast cancer   Colon cancer Cousin          paternal first cousin   Colon polyps Neg Hx             Amy Vincent mother was diagnosed with breast cancer at age 2, she died at age 99. Her mother's identical twin sister was diagnosed with breast cancer in her 93s, she died at age 56. This aunt's granddaughter (Amy Vincent first cousin once removed) was diagnosed with triple negative breast cancer in her 30s. Amy Vincent has 5 additional maternal aunts who were all diagnosed with breast cancer at unknown ages, all are deceased. Her paternal uncle was diagnosed with prostate cancer at age 52 and this uncle's daughter (Amy Vincent first cousin) was diagnosed with colon cancer. Ms. Reveal reports her sister had negative genetic testing and there is no reported Ashkenazi Jewish ancestry.    GENETIC TEST RESULTS:  The [facility]  Multi-Cancer Panel found no pathogenic mutations.   The Multi-Cancer + RNA Panel offered by [facility] includes sequencing and/or deletion/duplication analysis of the following 70 genes:  AIP*, ALK, APC*, ATM*, AXIN2*, BAP1*, BARD1*, BLM*, BMPR1A*, BRCA1*, BRCA2*, BRIP1*, CDC73*, CDH1*, CDK4, CDKN1B*, CDKN2A, CHEK2*, CTNNA1*, DICER1*, EPCAM (del/dup only), EGFR, FH*, FLCN*, GREM1 (promoter dup only), HOXB13, KIT, LZTR1, MAX*, MBD4, MEN1*, MET, MITF, MLH1*, MSH2*, MSH3*,  MSH6*, MUTYH*, NF1*, NF2*, NTHL1*, PALB2*, PDGFRA, PMS2*, POLD1*, POLE*, POT1*, PRKAR1A*, PTCH1*, PTEN*, RAD51C*, RAD51D*, RB1*, RET, SDHA* (sequencing only), SDHAF2*, SDHB*, SDHC*, SDHD*, SMAD4*, SMARCA4*, SMARCB1*, SMARCE1*, STK11*, SUFU*, TMEM127*, TP53*, TSC1*, TSC2*, VHL*. RNA analysis is performed for * genes.  The test report has been scanned into EPIC and is located under the Molecular Pathology section of the Results Review tab.  A portion of the result report is included below for reference. Genetic testing reported out on 12/19/2022.     Genetic testing identified a variant of uncertain significance (VUS) in the [facility] gene called c.4681G>A.  At this time, it is unknown if this variant is associated with an increased risk for cancer or if it is benign, but most uncertain variants are reclassified to benign. It should not be used to make medical management decisions. With time, we suspect the laboratory will determine the significance of this variant, if any. If the laboratory reclassifies this variant, we will attempt to contact Amy Vincent to discuss it further.   Even though a pathogenic variant was not identified, possible explanations for the cancer in the family may include: There may be no hereditary risk for cancer in the family. The cancers in [name] and/or her family may be due to other genetic or environmental factors. There may be a gene mutation in one of these genes that current testing methods cannot detect, but that chance is small. There could be another gene that has not yet been discovered, or that we have not yet tested, that is responsible for the cancer diagnoses in the family.  It is also possible there is a hereditary cause for the cancer in the family that [name] did not inherit.  Therefore, it is important to remain in touch with cancer genetics in the future so that we can continue to offer Amy Vincent the most up to date genetic testing.   ADDITIONAL  GENETIC TESTING:  We discussed with [name] that her genetic testing was fairly extensive.  If there are genes identified to increase cancer risk that can be analyzed in the future, we would be happy to discuss and coordinate this testing at that time.    CANCER SCREENING RECOMMENDATIONS:  Ms. Rusek test result is considered negative (normal).  This means that we have not identified a hereditary cause for her personal and family history of cancer at this time.   An individual's cancer risk and medical management are not determined by genetic test results alone. Overall cancer risk assessment incorporates additional factors, including personal medical history, family history, and any available genetic information that may result in a personalized plan for cancer prevention and surveillance. Therefore, it is recommended she continue to follow the cancer management and screening guidelines provided by her oncology and primary healthcare provider.  RECOMMENDATIONS FOR FAMILY MEMBERS:   Individuals in this family might be at some increased risk of developing cancer, over the general population risk, due to the family history of cancer. We recommend women in this family have a yearly mammogram beginning at age 46, or 64 years younger  than the earliest onset of cancer, an annual clinical breast exam, and perform monthly breast self-exams.  Other members of the family may still carry a pathogenic variant in one of these genes that Ms. Blydenburgh did not inherit. Based on the family history, we recommend her brother have genetic counseling and testing.  We do not recommend familial testing for the [facility] variant of uncertain significance (VUS).  FOLLOW-UP:  Cancer genetics is a rapidly advancing field and it is possible that new genetic tests will be appropriate for her and/or her family members in the future. We encouraged her to remain in contact with cancer genetics on an annual basis so we can update her  personal and family histories and let her know of advances in cancer genetics that may benefit this family.   Our contact number was provided. Ms. Niese questions were answered to her satisfaction, and she knows she is welcome to call us at anytime with additional questions or concerns.   [name], MS, [facility] Genetic Counselor [email or web address] (P) [phone]

## 2023-02-27 ENCOUNTER — Ambulatory Visit
Admission: RE | Admit: 2023-02-27 | Discharge: 2023-02-27 | Disposition: A | Discharge status: Home / Self Care | Payer: Medicare HMO | Source: Ambulatory Visit | Attending: Radiation Oncology | Admitting: Radiation Oncology

## 2023-02-27 NOTE — Progress Notes (Addendum)
Radiation Oncology         [phone]) [phone] ________________________________  Name: Amy Vincent MRN: [id number]  Date of Service: 02/27/2023  DOB: 07-18-1955  Post Treatment Telephone Note  Diagnosis:  Intermediate Grade, ER/PR positive DCIS of the left breast. (as documented in provider EOT note)  The patient was not available for call today. Voicemail left.  The patient was encouraged to avoid sun exposure in the area of prior treatment for up to one year following radiation with either sunscreen or by the style of clothing worn in the sun.  The patient has scheduled follow up with her medical oncologist Dr. Pamelia Hoit for ongoing surveillance, and was encouraged to call if she develops concerns or questions regarding radiation.   Ruel Favors, LPN

## 2023-03-17 ENCOUNTER — Other Ambulatory Visit: Payer: Self-pay | Admitting: Internal Medicine

## 2023-03-17 ENCOUNTER — Ambulatory Visit
Admission: RE | Admit: 2023-03-17 | Discharge: 2023-03-17 | Disposition: A | Discharge status: Home / Self Care | Payer: Medicare HMO | Source: Ambulatory Visit | Attending: Internal Medicine | Admitting: Internal Medicine

## 2023-03-17 DIAGNOSIS — R058 Other specified cough: Secondary | ICD-10-CM

## 2023-03-22 ENCOUNTER — Inpatient Hospital Stay: Payer: Medicare HMO | Attending: Radiation Oncology | Admitting: Adult Health

## 2023-03-22 ENCOUNTER — Telehealth: Payer: Self-pay

## 2023-03-22 ENCOUNTER — Encounter: Payer: Self-pay | Admitting: Adult Health

## 2023-03-22 VITALS — BP 117/67 | HR 53 | Temp 97.5°F | Resp 18 | Ht 63.0 in | Wt 198.8 lb

## 2023-03-22 DIAGNOSIS — Z923 Personal history of irradiation: Secondary | ICD-10-CM | POA: Diagnosis not present

## 2023-03-22 DIAGNOSIS — Z87891 Personal history of nicotine dependence: Secondary | ICD-10-CM | POA: Diagnosis not present

## 2023-03-22 DIAGNOSIS — D0512 Intraductal carcinoma in situ of left breast: Secondary | ICD-10-CM | POA: Insufficient documentation

## 2023-03-22 DIAGNOSIS — Z79810 Long term (current) use of selective estrogen receptor modulators (SERMs): Secondary | ICD-10-CM | POA: Diagnosis not present

## 2023-03-22 MED ORDER — TAMOXIFEN CITRATE 10 MG PO TABS: 10.0000 mg | ORAL_TABLET | Freq: Every day | ORAL | 3 refills | Status: DC

## 2023-03-22 NOTE — Telephone Encounter (Signed)
Pt called in stating that she never received the tamoxifen from 6 months ago, so she will wait on the the medication from [facility] that was sent in by [place].

## 2023-03-22 NOTE — Telephone Encounter (Signed)
Thanks D. Tamoxifen was sent.

## 2023-03-22 NOTE — Progress Notes (Signed)
SURVIVORSHIP VISIT:  BRIEF ONCOLOGIC HISTORY:  Oncology History  Ductal carcinoma in situ (DCIS) of left breast  09/27/2022 Initial Diagnosis   Screening mammogram detected left breast asymmetry, no ultrasound correlate, stereotactic biopsy: Low-grade DCIS ER 100%, PR 95%   10/13/2022 Surgery   Left lumpectomy: DCIS solid type intermediate grade without necrosis, 1 cm, margins negative, ER 100%, PR 95%   11/24/2022 - 12/21/2022 Radiation Therapy   Plan Name: Breast_L_BH Site: Breast, Left Technique: 3D Mode: Photon Dose Per Fraction: 2.66 Gy Prescribed Dose (Delivered / Prescribed): 42.56 Gy / 42.56 Gy Prescribed Fxs (Delivered / Prescribed): 16 / 16   Plan Name: Brst_L_Bst_BH Site: Breast, Left Technique: 3D Mode: Photon Dose Per Fraction: 2 Gy Prescribed Dose (Delivered / Prescribed): 8 Gy / 8 Gy Prescribed Fxs (Delivered / Prescribed): 4 / 4   12/2022 -  Anti-estrogen oral therapy   Tamoxifen x 5 years   12/19/2022 Genetic Testing   [facility] Multi-Cancer Panel+RNA was Negative. Of note, a variant of uncertain significance was detected in the [facility] gene (c.4681G>A).  The Multi-Cancer + RNA Panel offered by [facility] includes sequencing and/or deletion/duplication analysis of the following 70 genes:  AIP*, ALK, APC*, ATM*, AXIN2*, BAP1*, BARD1*, BLM*, BMPR1A*, BRCA1*, BRCA2*, BRIP1*, CDC73*, CDH1*, CDK4, CDKN1B*, CDKN2A, CHEK2*, CTNNA1*, DICER1*, EPCAM (del/dup only), EGFR, FH*, FLCN*, GREM1 (promoter dup only), HOXB13, KIT, LZTR1, MAX*, MBD4, MEN1*, MET, MITF, MLH1*, MSH2*, MSH3*, MSH6*, MUTYH*, NF1*, NF2*, NTHL1*, PALB2*, PDGFRA, PMS2*, POLD1*, POLE*, POT1*, PRKAR1A*, PTCH1*, PTEN*, RAD51C*, RAD51D*, RB1*, RET, SDHA* (sequencing only), SDHAF2*, SDHB*, SDHC*, SDHD*, SMAD4*, SMARCA4*, SMARCB1*, SMARCE1*, STK11*, SUFU*, TMEM127*, TP53*, TSC1*, TSC2*, VHL*. RNA analysis is performed for * genes.   03/22/2023 Cancer Staging   Staging form: Breast, AJCC 8th Edition - Pathologic: Stage 0  (pTis (DCIS), pN0, cM0, ER+, PR+) - Signed by Loa Socks, NP on 03/22/2023     INTERVAL HISTORY:  Ms. Caleca to review her survivorship care plan detailing her treatment course for breast cancer, as well as monitoring long-term side effects of that treatment, education regarding health maintenance, screening, and overall wellness and health promotion.     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 However, due to a pharmacy mix-up, the patient did not receive the medication and has not started the treatment. The patient was not aware of the prescription and expressed confusion about the medication's purpose.  The impression that she was given a lot of medications for an upper respiratory infection and wonders if he has the medication but never took it.  The patient is also interested in working on weight management, with a focus on reducing processed food intake and increasing physical activity. The patient reported a preference for chips, which she identified as a barrier to weight loss.  She notes occasional left breast heaviness after removing her bra, which was attributed to post-radiation swelling.   REVIEW OF SYSTEMS:  Review of Systems  Constitutional:  Negative for appetite change, chills, fatigue, fever and unexpected weight change.  HENT:   Negative for hearing loss, lump/mass and trouble swallowing.   Eyes:  Negative for eye problems and icterus.  Respiratory:  Negative for chest tightness, cough and shortness of breath.   Cardiovascular:  Negative for chest pain, leg swelling and palpitations.  Gastrointestinal:  Negative for abdominal distention, abdominal pain, constipation, diarrhea, nausea and vomiting.  Endocrine: Negative for hot flashes.  Genitourinary:  Negative  for difficulty urinating.   Musculoskeletal:  Negative for  arthralgias.  Skin:  Negative for itching and rash.  Neurological:  Negative for dizziness, extremity weakness, headaches and numbness.  Hematological:  Negative for adenopathy. Does not bruise/bleed easily.  Psychiatric/Behavioral:  Negative for depression. The patient is not nervous/anxious.    Breast: Denies any new nodularity, masses, tenderness, nipple changes, or nipple discharge.       PAST MEDICAL/SURGICAL HISTORY:  Past Medical History:  Diagnosis Date   Breast cancer (HCC) 09/22/2022   High cholesterol    Hx of adenomatous polyp of rectum 07/07/2016   Hypertension    Past Surgical History:  Procedure Laterality Date   ABDOMINAL HYSTERECTOMY  1999   BREAST BIOPSY Left 09/22/2022   MM LT BREAST BX W LOC DEV 1ST LESION IMAGE BX SPEC STEREO GUIDE 09/22/2022 [facility]   BREAST BIOPSY  10/12/2022   MM LT RADIOACTIVE SEED LOC MAMMO GUIDE 10/12/2022 [facility]   BREAST LUMPECTOMY WITH RADIOACTIVE SEED LOCALIZATION Left 10/13/2022   Procedure: LEFT BREAST LUMPECTOMY WITH RADIOACTIVE SEED LOCALIZATION;  Surgeon: Manus Rudd, MD;  Location:  SUR[facility]ERY CENTER;  Service: General;  Laterality: Left;   COLONOSCOPY  2006   Dr. Mann/ Normal Colonoscopy   INCISION AND DRAINAGE     I & D of an abscess on back   left arm surgery  2004   orthopedic surgery left arm due to fracture as a child that healed improperly     ALLERGIES:  No Known Allergies   CURRENT MEDICATIONS:  Outpatient Encounter Medications as of 03/22/2023  Medication Sig   amLODipine (NORVASC) 10 MG tablet 10 mg.   atenolol (TENORMIN) 50 MG tablet Take 50 mg by mouth daily.   atorvastatin (LIPITOR) 20 MG tablet Take 20 mg by mouth daily.   losartan-hydrochlorothiazide (HYZAAR) 100-25 MG tablet Take 1 tablet by mouth daily.   meloxicam (MOBIC) 7.5 MG tablet Take by mouth.   tamoxifen (NOLVADEX) 10 MG tablet Take 1 tablet (10 mg total) by mouth daily.   Facility-Administered Encounter  Medications as of 03/22/2023  Medication   0.9 %  sodium chloride infusion     ONCOLOGIC FAMILY HISTORY:  Family History  Problem Relation Age of Onset   Breast cancer Mother 63   Diabetes Father    Breast cancer Maternal Aunt 41 - 59   Breast cancer Maternal Aunt    Breast cancer Maternal Aunt    Breast cancer Maternal Aunt    Breast cancer Maternal Aunt        dx. <50   Stomach cancer Maternal Aunt    Hypertension Paternal Uncle    Prostate cancer Paternal Uncle 88   Breast cancer Cousin 46       maternal first cousin once removed with triple negative breast cancer   Colon cancer Cousin        paternal first cousin   Colon polyps Neg Hx      SOCIAL HISTORY:  Social History   Socioeconomic History   Marital status: Significant Other    Spouse name: Not on file   Number of children: 0   Years of education: Not on file   Highest education level: Not on file  Occupational History   Not on file  Tobacco Use   Smoking status: Former    Current packs/day: 0.00    Average packs/day: 1 pack/day for 15.0 years (15.0 ttl pk-yrs)    Types: Cigarettes    Start date: 05/10/1979  Quit date: 05/09/1994    Years since quitting: 28.8   Smokeless tobacco: Never  Vaping Use   Vaping status: Never Used  Substance and Sexual Activity   Alcohol use: Yes    Alcohol/week: 5.0 standard drinks of alcohol    Types: 2 Shots of liquor, 3 Standard drinks or equivalent per week    Comment: 2 a week   Drug use: No   Sexual activity: Not on file  Other Topics Concern   Not on file  Social History Narrative   No children   On disability   Originally from [place]   Single, sister and brother liv   Lives with girlfriend    Enjoys gardening, antique "flips"   Social Determinants of Health   Financial Resource Strain: Not on file  Food Insecurity: No Food Insecurity (10/07/2022)   Hunger Vital Sign    Worried About Running Out of Food in the Last Year: Never true    Ran Out of Food in the  Last Year: Never true  Transportation Needs: No Transportation Needs (10/07/2022)   PRAPARE - Administrator, Civil Service (Medical): No    Lack of Transportation (Non-Medical): No  Physical Activity: Not on file  Stress: Not on file  Social Connections: Not on file  Intimate Partner Violence: Not At Risk (10/07/2022)   Humiliation, Afraid, Rape, and Kick questionnaire    Fear of Current or Ex-Partner: No    Emotionally Abused: No    Physically Abused: No    Sexually Abused: No     OBSERVATIONS/OBJECTIVE:  BP 117/67 (BP Location: Left Arm, Patient Position: Sitting)   Pulse (!) 53   Temp (!) 97.5 F (36.4 C) (Temporal)   Resp 18   Ht 5\' 3"  (1.6 m)   Wt 198 lb 12.8 oz (90.2 kg)   SpO2 97%   BMI 35.22 kg/m  GENERAL: Patient is a well appearing female in no acute distress HEENT:  Sclerae anicteric.  Oropharynx clear and moist. No ulcerations or evidence of oropharyngeal candidiasis. Neck is supple.  NODES:  No cervical, supraclavicular, or axillary lymphadenopathy palpated.  BREAST EXAM: Right breast is benign left breast status postlumpectomy and radiation slight fullness in the breast with radiation changes no signs of local recurrence LUNGS:  Clear to auscultation bilaterally.  No wheezes or rhonchi. HEART:  Regular rate and rhythm. No murmur appreciated. ABDOMEN:  Soft, nontender.  Positive, normoactive bowel sounds. No organomegaly palpated. MSK:  No focal spinal tenderness to palpation. Full range of motion bilaterally in the upper extremities. EXTREMITIES:  No peripheral edema.   SKIN:  Clear with no obvious rashes or skin changes. No nail dyscrasia. NEURO:  Nonfocal. Well oriented.  Appropriate affect.   LABORATORY DATA:  None for this visit.  DIAGNOSTIC IMAGING:  None for this visit.      ASSESSMENT AND PLAN:  Ms.. Marchenko is a pleasant 67 y.o. female with stage 0 left breast DCIS, ER+/PR+, diagnosed in 09/2022, treated with lumpectomy, adjuvant  radiation therapy, and anti-estrogen therapy with Tamoxifen beginning in 12/2022.  She presents to the Survivorship Clinic for our initial meeting and routine follow-up post-completion of treatment for breast cancer.    1. Stage 0 left breast cancer:  Ms. Mierzwa is continuing to recover from definitive treatment for breast cancer. She will follow-up with her medical oncologist, Dr.  Pamelia Hoit in 6 months with history and physical exam per surveillance protocol.  She will continue her anti-estrogen therapy with Tamoxifen. Thus far,  she is tolerating the Tamoxifen well, with minimal side effects. Her mammogram is due 08/2023; orders placed today.   Today, a comprehensive survivorship care plan and treatment summary was reviewed with the patient today detailing her breast cancer diagnosis, treatment course, potential late/long-term effects of treatment, appropriate follow-up care with recommendations for the future, and patient education resources.  A copy of this summary, along with a letter will be sent to the patient's primary care provider via mail/fax/In Basket message after today's visit.    2. Post-Lumpectomy and Radiation Reports occasional twinges in the breast and heaviness upon removing bra. No signs of lymphedema or seroma. -Continue self-breast massage to help with scar tissue and potential swelling. -Refer to physical therapy if symptoms worsen.  3. Bone health:  She was given education on specific activities to promote bone health.  4. Cancer screening:  Due to Ms. Kissick's history and her age, she should receive screening for skin cancers, colon cancer, and gynecologic cancers.  The information and recommendations are listed on the patient's comprehensive care plan/treatment summary and were reviewed in detail with the patient.    5. Health maintenance and wellness promotion: Ms. Chavarria was encouraged to consume 5-7 servings of fruits and vegetables per day. We reviewed the "Nutrition Rainbow"  handout.  She was also encouraged to engage in moderate to vigorous exercise for 30 minutes per day most days of the week.  She was instructed to limit her alcohol consumption and continue to abstain from tobacco use.     6. Support services/counseling: It is not uncommon for this period of the patient's cancer care trajectory to be one of many emotions and stressors.   She was given information regarding our available services and encouraged to contact me with any questions or for help enrolling in any of our support group/programs.    Follow up instructions:    -Return to cancer center in 6 months for f/u with Dr. Pamelia Hoit  -Mammogram due in 08/2023 -She is welcome to return back to the Survivorship Clinic at any time; no additional follow-up needed at this time.  -Consider referral back to survivorship as a long-term survivor for continued surveillance  The patient was provided an opportunity to ask questions and all were answered. The patient agreed with the plan and demonstrated an understanding of the instructions.   Total encounter time:45 minutes*in face-to-face visit time, chart review, lab review, care coordination, order entry, and documentation of the encounter time.    Lillard Anes, NP 03/22/23 9:26 AM Medical Oncology and Hematology [facility] [address] [place], [place] [address] Tel. [phone]    Fax. [phone]  *Total Encounter Time as defined by the Centers for Medicare and Medicaid Services includes, in addition to the face-to-face time of a patient visit (documented in the note above) non-face-to-face time: obtaining and reviewing outside history, ordering and reviewing medications, tests or procedures, care coordination (communications with other health care professionals or caregivers) and documentation in the medical record.

## 2023-03-23 ENCOUNTER — Telehealth: Payer: Self-pay | Admitting: Hematology and Oncology

## 2023-03-23 NOTE — Telephone Encounter (Signed)
Spoke with patient confirming upcoming appointment  

## 2023-04-25 ENCOUNTER — Encounter: Payer: Self-pay | Admitting: Family Medicine

## 2023-05-10 HISTORY — PX: CATARACT EXTRACTION: SUR2

## 2023-06-13 DIAGNOSIS — H25812 Combined forms of age-related cataract, left eye: Secondary | ICD-10-CM | POA: Diagnosis not present

## 2023-06-15 ENCOUNTER — Other Ambulatory Visit: Payer: Self-pay | Admitting: Internal Medicine

## 2023-06-21 ENCOUNTER — Encounter: Payer: Self-pay | Admitting: Internal Medicine

## 2023-07-12 DIAGNOSIS — H2511 Age-related nuclear cataract, right eye: Secondary | ICD-10-CM | POA: Diagnosis not present

## 2023-07-14 DIAGNOSIS — H2511 Age-related nuclear cataract, right eye: Secondary | ICD-10-CM | POA: Diagnosis not present

## 2023-07-14 DIAGNOSIS — H25811 Combined forms of age-related cataract, right eye: Secondary | ICD-10-CM | POA: Diagnosis not present

## 2023-08-11 ENCOUNTER — Encounter: Payer: Self-pay | Admitting: Internal Medicine

## 2023-08-11 DIAGNOSIS — R252 Cramp and spasm: Secondary | ICD-10-CM | POA: Diagnosis not present

## 2023-08-11 DIAGNOSIS — R600 Localized edema: Secondary | ICD-10-CM | POA: Diagnosis not present

## 2023-08-11 DIAGNOSIS — I119 Hypertensive heart disease without heart failure: Secondary | ICD-10-CM | POA: Diagnosis not present

## 2023-08-11 DIAGNOSIS — R7303 Prediabetes: Secondary | ICD-10-CM | POA: Diagnosis not present

## 2023-08-11 DIAGNOSIS — E782 Mixed hyperlipidemia: Secondary | ICD-10-CM | POA: Diagnosis not present

## 2023-08-11 DIAGNOSIS — M5451 Vertebrogenic low back pain: Secondary | ICD-10-CM | POA: Diagnosis not present

## 2023-08-11 DIAGNOSIS — R0609 Other forms of dyspnea: Secondary | ICD-10-CM | POA: Diagnosis not present

## 2023-08-11 DIAGNOSIS — R197 Diarrhea, unspecified: Secondary | ICD-10-CM | POA: Diagnosis not present

## 2023-08-11 DIAGNOSIS — D0512 Intraductal carcinoma in situ of left breast: Secondary | ICD-10-CM | POA: Diagnosis not present

## 2023-08-11 DIAGNOSIS — Z6834 Body mass index (BMI) 34.0-34.9, adult: Secondary | ICD-10-CM | POA: Diagnosis not present

## 2023-09-05 ENCOUNTER — Ambulatory Visit
Admission: RE | Admit: 2023-09-05 | Discharge: 2023-09-05 | Disposition: A | Discharge status: Home / Self Care | Payer: Medicare HMO | Source: Ambulatory Visit | Attending: Adult Health | Admitting: Adult Health

## 2023-09-05 DIAGNOSIS — D0512 Intraductal carcinoma in situ of left breast: Secondary | ICD-10-CM

## 2023-09-05 DIAGNOSIS — Z86000 Personal history of in-situ neoplasm of breast: Secondary | ICD-10-CM | POA: Diagnosis not present

## 2023-09-05 DIAGNOSIS — Z08 Encounter for follow-up examination after completed treatment for malignant neoplasm: Secondary | ICD-10-CM | POA: Diagnosis not present

## 2023-09-06 ENCOUNTER — Telehealth: Payer: Self-pay

## 2023-09-06 ENCOUNTER — Ambulatory Visit (AMBULATORY_SURGERY_CENTER)

## 2023-09-06 VITALS — Ht 63.0 in | Wt 192.0 lb

## 2023-09-06 DIAGNOSIS — Z860100 Personal history of colon polyps, unspecified: Secondary | ICD-10-CM

## 2023-09-06 MED ORDER — NA SULFATE-K SULFATE-MG SULF 17.5-3.13-1.6 GM/177ML PO SOLN: 1.0000 | Freq: Once | ORAL | 0 refills | Status: AC

## 2023-09-06 NOTE — Progress Notes (Signed)

## 2023-09-06 NOTE — Telephone Encounter (Signed)
 Patient called back and completed visit

## 2023-09-20 ENCOUNTER — Inpatient Hospital Stay: Attending: Hematology and Oncology

## 2023-09-20 ENCOUNTER — Inpatient Hospital Stay: Payer: Medicare HMO | Admitting: Hematology and Oncology

## 2023-09-20 DIAGNOSIS — Z170 Estrogen receptor positive status [ER+]: Secondary | ICD-10-CM | POA: Insufficient documentation

## 2023-09-20 DIAGNOSIS — D0512 Intraductal carcinoma in situ of left breast: Secondary | ICD-10-CM | POA: Insufficient documentation

## 2023-09-20 DIAGNOSIS — Z1721 Progesterone receptor positive status: Secondary | ICD-10-CM | POA: Insufficient documentation

## 2023-09-20 DIAGNOSIS — Z79810 Long term (current) use of selective estrogen receptor modulators (SERMs): Secondary | ICD-10-CM | POA: Insufficient documentation

## 2023-09-20 DIAGNOSIS — Z923 Personal history of irradiation: Secondary | ICD-10-CM | POA: Insufficient documentation

## 2023-09-21 ENCOUNTER — Inpatient Hospital Stay: Admitting: Hematology and Oncology

## 2023-09-21 ENCOUNTER — Encounter

## 2023-09-21 VITALS — BP 123/61 | HR 59 | Temp 98.2°F | Resp 16 | Ht 63.0 in | Wt 200.4 lb

## 2023-09-21 DIAGNOSIS — Z79810 Long term (current) use of selective estrogen receptor modulators (SERMs): Secondary | ICD-10-CM | POA: Diagnosis not present

## 2023-09-21 DIAGNOSIS — D0512 Intraductal carcinoma in situ of left breast: Secondary | ICD-10-CM | POA: Diagnosis not present

## 2023-09-21 DIAGNOSIS — Z170 Estrogen receptor positive status [ER+]: Secondary | ICD-10-CM | POA: Diagnosis not present

## 2023-09-21 DIAGNOSIS — Z1721 Progesterone receptor positive status: Secondary | ICD-10-CM | POA: Diagnosis not present

## 2023-09-21 DIAGNOSIS — Z923 Personal history of irradiation: Secondary | ICD-10-CM | POA: Diagnosis not present

## 2023-09-21 NOTE — Progress Notes (Signed)
 Patient Care Team: Ferrell Hu, MD as PCP - General (Internal Medicine) Wilburn Handler, MD as Consulting Physician (Family Medicine) Dareen Ebbing, MD as Consulting Physician (General Surgery) Cameron Cea, MD as Consulting Physician (Hematology and Oncology) Johna Myers, MD as Consulting Physician (Radiation Oncology)  DIAGNOSIS:  Encounter Diagnosis  Name Primary?   Ductal carcinoma in situ (DCIS) of left breast Yes    SUMMARY OF ONCOLOGIC HISTORY: Oncology History  Ductal carcinoma in situ (DCIS) of left breast  09/27/2022 Initial Diagnosis   Screening mammogram detected left breast asymmetry, no ultrasound correlate, stereotactic biopsy: Low-grade DCIS ER 100%, PR 95%   10/13/2022 Surgery   Left lumpectomy: DCIS solid type intermediate grade without necrosis, 1 cm, margins negative, ER 100%, PR 95%   11/24/2022 - 12/21/2022 Radiation Therapy   Plan Name: Breast_L_BH Site: Breast, Left Technique: 3D Mode: Photon Dose Per Fraction: 2.66 Gy Prescribed Dose (Delivered / Prescribed): 42.56 Gy / 42.56 Gy Prescribed Fxs (Delivered / Prescribed): 16 / 16   Plan Name: Brst_L_Bst_BH Site: Breast, Left Technique: 3D Mode: Photon Dose Per Fraction: 2 Gy Prescribed Dose (Delivered / Prescribed): 8 Gy / 8 Gy Prescribed Fxs (Delivered / Prescribed): 4 / 4   12/19/2022 Genetic Testing   [facility] Multi-Cancer Panel+RNA was Negative. Of note, a variant of uncertain significance was detected in the [facility] gene (c.4681G>A).  The Multi-Cancer + RNA Panel offered by [facility] includes sequencing and/or deletion/duplication analysis of the following 70 genes:  AIP*, ALK, APC*, ATM*, AXIN2*, BAP1*, BARD1*, BLM*, BMPR1A*, BRCA1*, BRCA2*, BRIP1*, CDC73*, CDH1*, CDK4, CDKN1B*, CDKN2A, CHEK2*, CTNNA1*, DICER1*, EPCAM (del/dup only), EGFR, FH*, FLCN*, GREM1 (promoter dup only), HOXB13, KIT, LZTR1, MAX*, MBD4, MEN1*, MET, MITF, MLH1*, MSH2*, MSH3*, MSH6*, MUTYH*, NF1*, NF2*, NTHL1*, PALB2*, PDGFRA,  PMS2*, POLD1*, POLE*, POT1*, PRKAR1A*, PTCH1*, PTEN*, RAD51C*, RAD51D*, RB1*, RET, SDHA* (sequencing only), SDHAF2*, SDHB*, SDHC*, SDHD*, SMAD4*, SMARCA4*, SMARCB1*, SMARCE1*, STK11*, SUFU*, TMEM127*, TP53*, TSC1*, TSC2*, VHL*. RNA analysis is performed for * genes.   03/2023 -  Anti-estrogen oral therapy   Tamoxifen  x 5 years   03/22/2023 Cancer Staging   Staging form: Breast, AJCC 8th Edition - Pathologic: Stage 0 (pTis (DCIS), pN0, cM0, ER+, PR+) - Signed by Percival Brace, NP on 03/22/2023     CHIEF COMPLIANT: Follow-up on tamoxifen  therapy  HISTORY OF PRESENT ILLNESS:  History of Present Illness Amy Vincent is a 68 year old female who presents for breast cancer follow-up.  She has been on tamoxifen  for nine to ten months without experiencing side effects such as hot flashes, joint cramps, or muscle issues. She continues to take tamoxifen  with an automatic refill system in place.  Her recent mammogram shows breast tissue density as a B, which is favorable for mammogram accuracy.  She maintains an active lifestyle with activities like walking and yard work. She considers herself slightly overweight.  Her family history includes an uncle who recently passed away from stomach cancer after initially having prostate cancer.     ALLERGIES:  has no known allergies.  MEDICATIONS:  Current Outpatient Medications  Medication Sig Dispense Refill   amLODipine (NORVASC) 10 MG tablet 10 mg.     atenolol (TENORMIN) 50 MG tablet Take 50 mg by mouth daily.     atorvastatin  (LIPITOR) 20 MG tablet Take 20 mg by mouth daily.     HYDROcodone -acetaminophen  (NORCO/VICODIN) 5-325 MG tablet Take 1 tablet by mouth 4 (four) times daily as needed.     losartan -hydrochlorothiazide  (HYZAAR) 100-25 MG tablet Take 1  tablet by mouth daily.     meloxicam  (MOBIC ) 7.5 MG tablet Take by mouth.     tamoxifen  (NOLVADEX ) 10 MG tablet Take 1 tablet (10 mg total) by mouth daily. 90 tablet 3   No  current facility-administered medications for this visit.    PHYSICAL EXAMINATION: ECOG PERFORMANCE STATUS: 1 - Symptomatic but completely ambulatory  Vitals:   09/21/23 1354  BP: 123/61  Pulse: (!) 59  Resp: 16  Temp: 98.2 F (36.8 C)  SpO2: 100%   Filed Weights   09/21/23 1354  Weight: 200 lb 6.4 oz (90.9 kg)      LABORATORY DATA:  I have reviewed the data as listed    Latest Ref Rng & Units 02/24/2018    6:35 AM 03/01/2016   10:15 AM 11/23/2015    4:43 PM  CMP  Glucose 70 - 99 mg/dL 91  99  81   BUN 8 - 23 mg/dL 15  9  24    Creatinine 0.44 - 1.00 mg/dL 1.30  8.65  7.84   Sodium 135 - 145 mmol/L 140  140  142   Potassium 3.5 - 5.1 mmol/L 3.8  4.1  4.3   Chloride 98 - 111 mmol/L 106  109  108   CO2 22 - 32 mmol/L 26  25  28    Calcium  8.9 - 10.3 mg/dL 9.5  9.4  9.4   Total Protein 6.5 - 8.1 g/dL  6.7    Total Bilirubin 0.3 - 1.2 mg/dL  0.9    Alkaline Phos 38 - 126 U/L  106    AST 15 - 41 U/L  16    ALT 14 - 54 U/L  19      Lab Results  Component Value Date   WBC 7.5 02/24/2018   HGB 15.3 (H) 02/24/2018   HCT 46.5 (H) 02/24/2018   MCV 89.4 02/24/2018   PLT 257 02/24/2018   NEUTROABS 3.5 02/24/2018    ASSESSMENT & PLAN:  Ductal carcinoma in situ (DCIS) of left breast 09/27/2022: Screening mammogram detected left breast asymmetry, biopsy revealed low-grade DCIS that was ER/PR positive 10/13/2022: Left lumpectomy: DCIS solid type intermediate grade without necrosis, 1 cm, margins negative, ER 100%, PR 95% 11/24/2022-12/21/2022: Adjuvant radiation 01/08/2023: Started tamoxifen    Tamoxifen  toxicities:  Breast cancer surveillance: Breast exam 09/21/2023: Benign Mammogram 09/05/2023: Benign breast density category B  Return to clinic in 1 year for follow-up ------------------------------------- Assessment and Plan Assessment & Plan Ductal carcinoma in situ (DCIS) of left breast On tamoxifen  for nine months without adverse effects. Mammograms show B density,  favorable for accuracy. - Continue tamoxifen  therapy. - Schedule annual mammograms.  Family history of cancer Family history includes uncle with prostate and stomach cancer, relevant for risk assessment.  General health maintenance Slightly overweight but active. Emphasized regular physical activity and hydration. - Encourage walking 30 minutes, five days a week. - Advise adequate daily hydration.      No orders of the defined types were placed in this encounter.  The patient has a good understanding of the overall plan. she agrees with it. she will call with any problems that may develop before the next visit here. Total time spent: 30 mins including face to face time and time spent for planning, charting and co-ordination of care   Viinay K Takira Sherrin, MD 09/21/23

## 2023-09-21 NOTE — Assessment & Plan Note (Signed)
 09/27/2022: Screening mammogram detected left breast asymmetry, biopsy revealed low-grade DCIS that was ER/PR positive 10/13/2022: Left lumpectomy: DCIS solid type intermediate grade without necrosis, 1 cm, margins negative, ER 100%, PR 95% 11/24/2022-12/21/2022: Adjuvant radiation 01/08/2023: Started tamoxifen    Tamoxifen  toxicities:  Breast cancer surveillance: Breast exam 09/21/2023: Benign Mammogram 09/05/2023: Benign breast density category B  Return to clinic in 1 year for follow-up

## 2023-09-26 NOTE — Progress Notes (Signed)
 [facility] History and Physical   Primary Care Physician:  Ferrell Hu, MD   Reason for Procedure:  History of adenomatous colon polyp  Plan:    Colonoscopy     HPI: Amy Vincent is a 68 y.o. female status post colonoscopy in 2018 with removal of a diminutive adenoma.  The examination was otherwise normal.  She is here for surveillance colonoscopy exam.   Past Medical History:  Diagnosis Date   Breast cancer (HCC) 09/22/2022   High cholesterol    Hx of adenomatous polyp of rectum 07/07/2016   Hypertension     Past Surgical History:  Procedure Laterality Date   ABDOMINAL HYSTERECTOMY  1999   BREAST BIOPSY Left 09/22/2022   MM LT BREAST BX W LOC DEV 1ST LESION IMAGE BX SPEC STEREO GUIDE 09/22/2022 [facility]   BREAST BIOPSY  10/12/2022   MM LT RADIOACTIVE SEED LOC MAMMO GUIDE 10/12/2022 [facility]   BREAST LUMPECTOMY WITH RADIOACTIVE SEED LOCALIZATION Left 10/13/2022   Procedure: LEFT BREAST LUMPECTOMY WITH RADIOACTIVE SEED LOCALIZATION;  Surgeon: Dareen Ebbing, MD;  Location: [facility];  Service: General;  Laterality: Left;   CATARACT EXTRACTION Bilateral 2025   COLONOSCOPY  2006   Dr. Mann/ Normal Colonoscopy   COLONOSCOPY W/ POLYPECTOMY  2018   INCISION AND DRAINAGE     I & D of an abscess on back   left arm surgery  2004   orthopedic surgery left arm due to fracture as a child that healed improperly    Prior to Admission medications   Medication Sig Start Date End Date Taking? Authorizing Provider  amLODipine (NORVASC) 10 MG tablet 10 mg. 04/22/22   [provider]  atenolol (TENORMIN) 50 MG tablet Take 50 mg by mouth daily. 12/27/19   [provider]  atorvastatin  (LIPITOR) 20 MG tablet Take 20 mg by mouth daily.    [provider]  HYDROcodone -acetaminophen  (NORCO/VICODIN) 5-325 MG tablet Take 1 tablet by mouth 4 (four) times daily as needed. 08/01/23   [provider]   losartan -hydrochlorothiazide  (HYZAAR) 100-25 MG tablet Take 1 tablet by mouth daily.    [provider]  meloxicam  (MOBIC ) 7.5 MG tablet Take by mouth.    [provider]  tamoxifen  (NOLVADEX ) 10 MG tablet Take 1 tablet (10 mg total) by mouth daily. 03/22/23   Percival Brace, NP    Current Outpatient Medications  Medication Sig Dispense Refill   amLODipine (NORVASC) 10 MG tablet 10 mg.     atenolol (TENORMIN) 50 MG tablet Take 50 mg by mouth daily.     atorvastatin  (LIPITOR) 20 MG tablet Take 20 mg by mouth daily.     losartan -hydrochlorothiazide  (HYZAAR) 100-25 MG tablet Take 1 tablet by mouth daily.     tamoxifen  (NOLVADEX ) 10 MG tablet Take 1 tablet (10 mg total) by mouth daily. 90 tablet 3   HYDROcodone -acetaminophen  (NORCO/VICODIN) 5-325 MG tablet Take 1 tablet by mouth 4 (four) times daily as needed.     meloxicam  (MOBIC ) 7.5 MG tablet Take by mouth.     Current Facility-Administered Medications  Medication Dose Route Frequency Provider Last Rate Last Admin   0.9 %  sodium chloride  infusion  500 mL Intravenous Once Kenney Peacemaker, MD        Allergies as of 09/27/2023   (No Known Allergies)    Family History  Problem Relation Age of Onset   Breast cancer Mother 42   Diabetes Father    Breast cancer  Maternal Aunt 50 - 59   Breast cancer Maternal Aunt    Breast cancer Maternal Aunt    Breast cancer Maternal Aunt    Breast cancer Maternal Aunt        dx. <50   Stomach cancer Maternal Aunt    Stomach cancer Paternal Uncle    Colon cancer Paternal Uncle    Colon polyps Paternal Uncle    Hypertension Paternal Uncle    Prostate cancer Paternal Uncle 44   Breast cancer Cousin 30       maternal first cousin once removed with triple negative breast cancer   Colon cancer Cousin        paternal first cousin   Esophageal cancer Neg Hx    Rectal cancer Neg Hx     Social History   Socioeconomic History   Marital status: Significant Other     Spouse name: Not on file   Number of children: 0   Years of education: Not on file   Highest education level: Not on file  Occupational History   Not on file  Tobacco Use   Smoking status: Former    Current packs/day: 0.00    Average packs/day: 1 pack/day for 15.0 years (15.0 ttl pk-yrs)    Types: Cigarettes    Start date: 05/10/1979    Quit date: 05/09/1994    Years since quitting: 29.4   Smokeless tobacco: Never  Vaping Use   Vaping status: Never Used  Substance and Sexual Activity   Alcohol use: Yes    Alcohol/week: 5.0 standard drinks of alcohol    Types: 2 Shots of liquor, 3 Standard drinks or equivalent per week    Comment: 2 a week   Drug use: No   Sexual activity: Not on file  Other Topics Concern   Not on file  Social History Narrative   No children   On disability   Originally from [place]   Single, sister and brother liv   Lives with girlfriend    Enjoys gardening, antique "flips"   Social Drivers of Corporate investment banker Strain: Not on file  Food Insecurity: No Food Insecurity (10/07/2022)   Hunger Vital Sign    Worried About Running Out of Food in the Last Year: Never true    Ran Out of Food in the Last Year: Never true  Transportation Needs: No Transportation Needs (10/07/2022)   PRAPARE - Administrator, Civil Service (Medical): No    Lack of Transportation (Non-Medical): No  Physical Activity: Not on file  Stress: Not on file  Social Connections: Not on file  Intimate Partner Violence: Not At Risk (10/07/2022)   Humiliation, Afraid, Rape, and Kick questionnaire    Fear of Current or Ex-Partner: No    Emotionally Abused: No    Physically Abused: No    Sexually Abused: No    Review of Systems:  All other review of systems negative except as mentioned in the HPI.  Physical Exam: Vital signs BP 119/63   Pulse (!) 46   Temp (!) 97.2 F (36.2 C) (Temporal)   Resp 14   Ht 5\' 3"  (1.6 m)   Wt 192 lb (87.1 kg)   SpO2 99%   BMI 34.01  kg/m   General:   Alert,  Well-developed, well-nourished, pleasant and cooperative in NAD Lungs:  Clear throughout to auscultation.   Heart:  Regular rate and rhythm; no murmurs, clicks, rubs,  or gallops. Abdomen:  Soft, nontender and  nondistended. Normal bowel sounds.   Neuro/Psych:  Alert and cooperative. Normal mood and affect. A and O x 3   @Amy Vincent  Amy Fall, MD, [facility] [phone] (pager) 09/27/2023 3:00 PM@

## 2023-09-27 ENCOUNTER — Encounter: Admitting: Internal Medicine

## 2023-09-27 ENCOUNTER — Encounter: Payer: Self-pay | Admitting: Internal Medicine

## 2023-09-27 ENCOUNTER — Ambulatory Visit (AMBULATORY_SURGERY_CENTER): Admitting: Internal Medicine

## 2023-09-27 VITALS — BP 98/59 | HR 65 | Temp 97.2°F | Resp 12 | Ht 63.0 in | Wt 192.0 lb

## 2023-09-27 DIAGNOSIS — D128 Benign neoplasm of rectum: Secondary | ICD-10-CM

## 2023-09-27 DIAGNOSIS — Z860101 Personal history of adenomatous and serrated colon polyps: Secondary | ICD-10-CM | POA: Diagnosis not present

## 2023-09-27 DIAGNOSIS — I10 Essential (primary) hypertension: Secondary | ICD-10-CM | POA: Diagnosis not present

## 2023-09-27 DIAGNOSIS — E7800 Pure hypercholesterolemia, unspecified: Secondary | ICD-10-CM | POA: Diagnosis not present

## 2023-09-27 DIAGNOSIS — Z1211 Encounter for screening for malignant neoplasm of colon: Secondary | ICD-10-CM

## 2023-09-27 DIAGNOSIS — K648 Other hemorrhoids: Secondary | ICD-10-CM

## 2023-09-27 DIAGNOSIS — Z860100 Personal history of colon polyps, unspecified: Secondary | ICD-10-CM

## 2023-09-27 MED ORDER — SODIUM CHLORIDE 0.9 % IV SOLN
500.0000 mL | Freq: Once | INTRAVENOUS | Status: DC
Start: 2023-09-27 — End: 2023-09-27

## 2023-09-27 NOTE — Progress Notes (Signed)
Pt's states no medical or surgical changes since previsit or office visit.Pt's states no medical or surgical changes since previsit or office visit. 

## 2023-09-27 NOTE — Progress Notes (Signed)
 Report given to PACU, vss

## 2023-09-27 NOTE — Progress Notes (Signed)
 1517 Ephedrine  10 mg given IV due to low BP, MD updated.

## 2023-09-27 NOTE — Op Note (Signed)
 [facility] Patient Name: Amy Vincent Procedure Date: 09/27/2023 2:45 PM MRN: [id number] Endoscopist: Kenney Peacemaker , MD, [id number] Age: 68 Referring MD:  Date of Birth: February 01, 1956 Gender: Female Account #: [id number] Procedure:                Colonoscopy Indications:              Surveillance: Personal history of adenomatous                            polyps on last colonoscopy > 5 years ago, Last                            colonoscopy: 2018 Medicines:                Monitored Anesthesia Care Procedure:                Pre-Anesthesia Assessment:                           - Prior to the procedure, a History and Physical                            was performed, and patient medications and                            allergies were reviewed. The patient's tolerance of                            previous anesthesia was also reviewed. The risks                            and benefits of the procedure and the sedation                            options and risks were discussed with the patient.                            All questions were answered, and informed consent                            was obtained. Prior Anticoagulants: The patient has                            taken no anticoagulant or antiplatelet agents. ASA                            Grade Assessment: II - A patient with mild systemic                            disease. After reviewing the risks and benefits,                            the patient was deemed in satisfactory condition to  undergo the procedure.                           After obtaining informed consent, the colonoscope                            was passed under direct vision. Throughout the                            procedure, the patient's blood pressure, pulse, and                            oxygen saturations were monitored continuously. The                            Olympus Scope [id number] was introduced through  the                            anus and advanced to the the cecum, identified by                            appendiceal orifice and ileocecal valve. The                            colonoscopy was performed without difficulty. The                            patient tolerated the procedure well. The quality                            of the bowel preparation was excellent. The                            ileocecal valve, appendiceal orifice, and rectum                            were photographed. The bowel preparation used was                            SUPREP via split dose instruction. Scope In: 3:10:29 PM Scope Out: 3:21:15 PM Scope Withdrawal Time: 0 hours 9 minutes 43 seconds  Total Procedure Duration: 0 hours 10 minutes 46 seconds  Findings:                 The perianal and digital rectal examinations were                            normal.                           A 15 mm polyp was found in the rectum. The polyp                            was sessile. The polyp was removed with a hot  snare. Resection and retrieval were complete.                            Verification of patient identification for the                            specimen was done. Estimated blood loss: none.                           Internal hemorrhoids were found. The hemorrhoids                            were small.                           The exam was otherwise without abnormality on                            direct and retroflexion views. Complications:            No immediate complications. Estimated Blood Loss:     Estimated blood loss: none. Impression:               - One 15 mm polyp in the rectum, removed with a hot                            snare. Resected and retrieved.                           - Internal hemorrhoids.                           - The examination was otherwise normal on direct                            and retroflexion views.                           -  Personal history of colonic polyp- diminutive                            adenoma removed 2018. Recommendation:           - Patient has a contact number available for                            emergencies. The signs and symptoms of potential                            delayed complications were discussed with the                            patient. Return to normal activities tomorrow.                            Written discharge instructions were provided to the  patient.                           - Resume previous diet.                           - Continue present medications.                           - Repeat colonoscopy is recommended for                            surveillance. The colonoscopy date will be                            determined after pathology results from today's                            exam become available for review.                           - No aspirin , ibuprofen , naproxen , or other                            non-steroidal anti-inflammatory drugs for 2 weeks                            after polyp removal. Kenney Peacemaker, MD 09/27/2023 3:28:43 PM This report has been signed electronically.

## 2023-09-27 NOTE — Patient Instructions (Addendum)
 There was one medium-sized polyp in the rectum which I removed.  Hemorrhoids also sen.  Otherwise normal.  I will let you know pathology results and when to have another routine colonoscopy by mail and/or My Chart. I appreciate the opportunity to care for you. Kenney Peacemaker, MD, FACG  YOU HAD AN ENDOSCOPIC PROCEDURE TODAY AT THE [facility]:   Refer to the procedure report that was given to you for any specific questions about what was found during the examination.  If the procedure report does not answer your questions, please call your gastroenterologist to clarify.  If you requested that your care partner not be given the details of your procedure findings, then the procedure report has been included in a sealed envelope for you to review at your convenience later.  YOU SHOULD EXPECT: Some feelings of bloating in the abdomen. Passage of more gas than usual.  Walking can help get rid of the air that was put into your GI tract during the procedure and reduce the bloating. If you had a lower endoscopy (such as a colonoscopy or flexible sigmoidoscopy) you may notice spotting of blood in your stool or on the toilet paper. If you underwent a bowel prep for your procedure, you may not have a normal bowel movement for a few days.  Please Note:  You might notice some irritation and congestion in your nose or some drainage.  This is from the oxygen used during your procedure.  There is no need for concern and it should clear up in a day or so.  SYMPTOMS TO REPORT IMMEDIATELY:  Following lower endoscopy (colonoscopy or flexible sigmoidoscopy):  Excessive amounts of blood in the stool  Significant tenderness or worsening of abdominal pains  Swelling of the abdomen that is new, acute  Fever of 100F or higher  Following upper endoscopy (EGD)  Vomiting of blood or coffee ground material  New chest pain or pain under the shoulder blades  Painful or persistently difficult  swallowing  New shortness of breath  Fever of 100F or higher  Black, tarry-looking stools  For urgent or emergent issues, a gastroenterologist can be reached at any hour by calling [phone]. Do not use MyChart messaging for urgent concerns.    DIET:  We do recommend a small meal at first, but then you may proceed to your regular diet.  Drink plenty of fluids but you should avoid alcoholic beverages for 24 hours.  ACTIVITY:  You should plan to take it easy for the rest of today and you should NOT DRIVE or use heavy machinery until tomorrow (because of the sedation medicines used during the test).    FOLLOW UP: Our staff will call the number listed on your records the next business day following your procedure.  We will call around 7:15- 8:00 am to check on you and address any questions or concerns that you may have regarding the information given to you following your procedure. If we do not reach you, we will leave a message.     If any biopsies were taken you will be contacted by phone or by letter within the next 1-3 weeks.  Please call us  at [phone] if you have not heard about the biopsies in 3 weeks.    SIGNATURES/CONFIDENTIALITY: You and/or your care partner have signed paperwork which will be entered into your electronic medical record.  These signatures attest to the fact that that the information above on your After Visit Summary has  been reviewed and is understood.  Full responsibility of the confidentiality of this discharge information lies with you and/or your care-partner.

## 2023-09-27 NOTE — Progress Notes (Signed)
 Called to room to assist during endoscopic procedure.  Patient ID and intended procedure confirmed with present staff. Received instructions for my participation in the procedure from the performing physician.

## 2023-09-28 ENCOUNTER — Telehealth: Payer: Self-pay

## 2023-09-28 NOTE — Telephone Encounter (Signed)
  Follow up Call-     09/27/2023    2:15 PM  Call back number  Post procedure Call Back phone  # [phone]  Permission to leave phone message Yes     Patient questions:  Do you have a fever, pain , or abdominal swelling? No. Pain Score  0 *  Have you tolerated food without any problems? Yes.    Have you been able to return to your normal activities? Yes.    Do you have any questions about your discharge instructions: Diet   No. Medications  No. Follow up visit  No.  Do you have questions or concerns about your Care? No.  Actions: * If pain score is 4 or above: No action needed, pain <4.

## 2023-10-03 LAB — SURGICAL PATHOLOGY

## 2023-10-04 ENCOUNTER — Ambulatory Visit: Payer: Self-pay | Admitting: Internal Medicine

## 2023-10-04 DIAGNOSIS — Z860101 Personal history of adenomatous and serrated colon polyps: Secondary | ICD-10-CM

## 2023-10-12 DIAGNOSIS — R7303 Prediabetes: Secondary | ICD-10-CM | POA: Diagnosis not present

## 2023-10-12 DIAGNOSIS — M1990 Unspecified osteoarthritis, unspecified site: Secondary | ICD-10-CM | POA: Diagnosis not present

## 2023-10-12 DIAGNOSIS — Z8249 Family history of ischemic heart disease and other diseases of the circulatory system: Secondary | ICD-10-CM | POA: Diagnosis not present

## 2023-10-12 DIAGNOSIS — E669 Obesity, unspecified: Secondary | ICD-10-CM | POA: Diagnosis not present

## 2023-10-12 DIAGNOSIS — F1210 Cannabis abuse, uncomplicated: Secondary | ICD-10-CM | POA: Diagnosis not present

## 2023-10-12 DIAGNOSIS — Z809 Family history of malignant neoplasm, unspecified: Secondary | ICD-10-CM | POA: Diagnosis not present

## 2023-10-12 DIAGNOSIS — N189 Chronic kidney disease, unspecified: Secondary | ICD-10-CM | POA: Diagnosis not present

## 2023-10-12 DIAGNOSIS — I129 Hypertensive chronic kidney disease with stage 1 through stage 4 chronic kidney disease, or unspecified chronic kidney disease: Secondary | ICD-10-CM | POA: Diagnosis not present

## 2023-10-12 DIAGNOSIS — Z87891 Personal history of nicotine dependence: Secondary | ICD-10-CM | POA: Diagnosis not present

## 2023-10-12 DIAGNOSIS — E785 Hyperlipidemia, unspecified: Secondary | ICD-10-CM | POA: Diagnosis not present

## 2023-10-12 DIAGNOSIS — M545 Low back pain, unspecified: Secondary | ICD-10-CM | POA: Diagnosis not present

## 2023-11-09 DIAGNOSIS — R7303 Prediabetes: Secondary | ICD-10-CM | POA: Diagnosis not present

## 2023-11-09 DIAGNOSIS — R0609 Other forms of dyspnea: Secondary | ICD-10-CM | POA: Diagnosis not present

## 2023-11-09 DIAGNOSIS — Z853 Personal history of malignant neoplasm of breast: Secondary | ICD-10-CM | POA: Diagnosis not present

## 2023-11-09 DIAGNOSIS — M5451 Vertebrogenic low back pain: Secondary | ICD-10-CM | POA: Diagnosis not present

## 2023-11-09 DIAGNOSIS — E782 Mixed hyperlipidemia: Secondary | ICD-10-CM | POA: Diagnosis not present

## 2023-11-09 DIAGNOSIS — Z6834 Body mass index (BMI) 34.0-34.9, adult: Secondary | ICD-10-CM | POA: Diagnosis not present

## 2023-11-09 DIAGNOSIS — I119 Hypertensive heart disease without heart failure: Secondary | ICD-10-CM | POA: Diagnosis not present

## 2023-11-09 DIAGNOSIS — R197 Diarrhea, unspecified: Secondary | ICD-10-CM | POA: Diagnosis not present

## 2023-11-09 DIAGNOSIS — D0512 Intraductal carcinoma in situ of left breast: Secondary | ICD-10-CM | POA: Diagnosis not present

## 2024-02-14 ENCOUNTER — Encounter: Payer: Self-pay | Admitting: *Deleted

## 2024-02-14 NOTE — Progress Notes (Signed)
 Amy Vincent                                          MRN: [id number]   02/14/2024   The [facility] Quality Team Specialist reviewed this patient medical record for the purposes of chart review for care gap closure. The following were reviewed: chart review for care gap closure-controlling blood pressure.    [facility] Quality Team

## 2024-02-23 DIAGNOSIS — M5451 Vertebrogenic low back pain: Secondary | ICD-10-CM | POA: Diagnosis not present

## 2024-02-23 DIAGNOSIS — I119 Hypertensive heart disease without heart failure: Secondary | ICD-10-CM | POA: Diagnosis not present

## 2024-02-23 DIAGNOSIS — E782 Mixed hyperlipidemia: Secondary | ICD-10-CM | POA: Diagnosis not present

## 2024-02-23 DIAGNOSIS — Z6834 Body mass index (BMI) 34.0-34.9, adult: Secondary | ICD-10-CM | POA: Diagnosis not present

## 2024-02-23 DIAGNOSIS — Z6835 Body mass index (BMI) 35.0-35.9, adult: Secondary | ICD-10-CM | POA: Diagnosis not present

## 2024-02-23 DIAGNOSIS — E66812 Obesity, class 2: Secondary | ICD-10-CM | POA: Diagnosis not present

## 2024-02-23 DIAGNOSIS — Z2821 Immunization not carried out because of patient refusal: Secondary | ICD-10-CM | POA: Diagnosis not present

## 2024-02-23 DIAGNOSIS — R0609 Other forms of dyspnea: Secondary | ICD-10-CM | POA: Diagnosis not present

## 2024-02-23 DIAGNOSIS — R7303 Prediabetes: Secondary | ICD-10-CM | POA: Diagnosis not present

## 2024-02-23 DIAGNOSIS — Z853 Personal history of malignant neoplasm of breast: Secondary | ICD-10-CM | POA: Diagnosis not present

## 2024-03-03 ENCOUNTER — Other Ambulatory Visit: Payer: Self-pay | Admitting: Adult Health

## 2024-09-19 ENCOUNTER — Ambulatory Visit: Admitting: Hematology and Oncology
